# Patient Record
Sex: Female | Born: 1935
Health system: Southern US, Community
[De-identification: ages and names within clinical notes are randomized; demographics above are authoritative.]

## PROBLEM LIST (undated history)

## (undated) DIAGNOSIS — G629 Polyneuropathy, unspecified: Secondary | ICD-10-CM

## (undated) DIAGNOSIS — E119 Type 2 diabetes mellitus without complications: Secondary | ICD-10-CM

## (undated) DIAGNOSIS — I10 Essential (primary) hypertension: Secondary | ICD-10-CM

## (undated) DIAGNOSIS — C801 Malignant (primary) neoplasm, unspecified: Secondary | ICD-10-CM

## (undated) HISTORY — PX: BACK SURGERY: SHX140

## (undated) HISTORY — PX: CHOLECYSTECTOMY: SHX55

## (undated) HISTORY — PX: BREAST EXCISIONAL BIOPSY: SUR124

## (undated) HISTORY — PX: OTHER SURGICAL HISTORY: SHX169

## (undated) HISTORY — PX: ABDOMINAL HYSTERECTOMY: SHX81

---

## 1958-03-21 HISTORY — PX: BREAST EXCISIONAL BIOPSY: SUR124

## 1968-03-21 HISTORY — PX: OTHER SURGICAL HISTORY: SHX169

## 1978-11-20 DIAGNOSIS — C801 Malignant (primary) neoplasm, unspecified: Secondary | ICD-10-CM

## 1978-11-20 HISTORY — DX: Malignant (primary) neoplasm, unspecified: C80.1

## 1999-02-03 ENCOUNTER — Encounter: Payer: Self-pay | Admitting: Internal Medicine

## 1999-02-03 ENCOUNTER — Encounter: Admission: RE | Admit: 1999-02-03 | Discharge: 1999-02-03 | Payer: Self-pay | Admitting: Internal Medicine

## 2000-02-16 ENCOUNTER — Encounter: Payer: Self-pay | Admitting: Internal Medicine

## 2000-02-16 ENCOUNTER — Encounter: Admission: RE | Admit: 2000-02-16 | Discharge: 2000-02-16 | Payer: Self-pay | Admitting: Internal Medicine

## 2000-09-08 ENCOUNTER — Ambulatory Visit (HOSPITAL_COMMUNITY): Admission: RE | Admit: 2000-09-08 | Discharge: 2000-09-08 | Payer: Self-pay | Admitting: Gastroenterology

## 2001-03-05 ENCOUNTER — Encounter: Payer: Self-pay | Admitting: Internal Medicine

## 2001-03-05 ENCOUNTER — Encounter: Admission: RE | Admit: 2001-03-05 | Discharge: 2001-03-05 | Payer: Self-pay | Admitting: Internal Medicine

## 2002-03-25 ENCOUNTER — Encounter: Payer: Self-pay | Admitting: Internal Medicine

## 2002-03-25 ENCOUNTER — Encounter: Admission: RE | Admit: 2002-03-25 | Discharge: 2002-03-25 | Payer: Self-pay | Admitting: Internal Medicine

## 2003-04-01 ENCOUNTER — Encounter: Admission: RE | Admit: 2003-04-01 | Discharge: 2003-04-01 | Payer: Self-pay | Admitting: Internal Medicine

## 2003-07-17 ENCOUNTER — Encounter: Admission: RE | Admit: 2003-07-17 | Discharge: 2003-07-17 | Payer: Self-pay | Admitting: Rheumatology

## 2004-04-15 ENCOUNTER — Encounter: Admission: RE | Admit: 2004-04-15 | Discharge: 2004-04-15 | Payer: Self-pay | Admitting: Internal Medicine

## 2004-05-26 ENCOUNTER — Encounter: Admission: RE | Admit: 2004-05-26 | Discharge: 2004-05-26 | Payer: Self-pay | Admitting: Orthopedic Surgery

## 2004-05-28 ENCOUNTER — Ambulatory Visit (HOSPITAL_COMMUNITY): Admission: RE | Admit: 2004-05-28 | Discharge: 2004-05-28 | Payer: Self-pay | Admitting: Orthopedic Surgery

## 2004-05-28 ENCOUNTER — Ambulatory Visit (HOSPITAL_BASED_OUTPATIENT_CLINIC_OR_DEPARTMENT_OTHER): Admission: RE | Admit: 2004-05-28 | Discharge: 2004-05-28 | Payer: Self-pay | Admitting: Orthopedic Surgery

## 2005-04-20 ENCOUNTER — Encounter: Admission: RE | Admit: 2005-04-20 | Discharge: 2005-04-20 | Payer: Self-pay | Admitting: Internal Medicine

## 2006-04-21 ENCOUNTER — Encounter: Admission: RE | Admit: 2006-04-21 | Discharge: 2006-04-21 | Payer: Self-pay | Admitting: Internal Medicine

## 2007-04-25 ENCOUNTER — Encounter: Admission: RE | Admit: 2007-04-25 | Discharge: 2007-04-25 | Payer: Self-pay | Admitting: Internal Medicine

## 2008-04-16 ENCOUNTER — Encounter: Admission: RE | Admit: 2008-04-16 | Discharge: 2008-04-16 | Payer: Self-pay | Admitting: Specialist

## 2008-05-23 ENCOUNTER — Encounter: Admission: RE | Admit: 2008-05-23 | Discharge: 2008-05-23 | Payer: Self-pay | Admitting: Internal Medicine

## 2008-05-29 ENCOUNTER — Inpatient Hospital Stay (HOSPITAL_COMMUNITY): Admission: RE | Admit: 2008-05-29 | Discharge: 2008-05-31 | Payer: Self-pay | Admitting: Specialist

## 2010-07-01 LAB — BASIC METABOLIC PANEL
BUN: 4 mg/dL — ABNORMAL LOW (ref 6–23)
BUN: 5 mg/dL — ABNORMAL LOW (ref 6–23)
BUN: 8 mg/dL (ref 6–23)
CO2: 26 mEq/L (ref 19–32)
CO2: 26 mEq/L (ref 19–32)
CO2: 29 mEq/L (ref 19–32)
Calcium: 8.3 mg/dL — ABNORMAL LOW (ref 8.4–10.5)
Calcium: 8.5 mg/dL (ref 8.4–10.5)
Calcium: 8.6 mg/dL (ref 8.4–10.5)
Chloride: 104 mEq/L (ref 96–112)
Chloride: 96 mEq/L (ref 96–112)
Chloride: 98 mEq/L (ref 96–112)
Creatinine, Ser: 0.44 mg/dL (ref 0.4–1.2)
Creatinine, Ser: 0.46 mg/dL (ref 0.4–1.2)
Creatinine, Ser: 0.47 mg/dL (ref 0.4–1.2)
GFR calc Af Amer: >60 mL/min (ref >60)
GFR calc Af Amer: >60 mL/min (ref >60)
GFR calc Af Amer: >60 mL/min (ref >60)
GFR calc non Af Amer: >60 mL/min (ref >60)
GFR calc non Af Amer: >60 mL/min (ref >60)
GFR calc non Af Amer: >60 mL/min (ref >60)
Glucose, Bld: 117 mg/dL — ABNORMAL HIGH (ref 70–99)
Glucose, Bld: 133 mg/dL — ABNORMAL HIGH (ref 70–99)
Glucose, Bld: 147 mg/dL — ABNORMAL HIGH (ref 70–99)
Potassium: 3.6 mEq/L (ref 3.5–5.1)
Potassium: 3.8 mEq/L (ref 3.5–5.1)
Potassium: 4 mEq/L (ref 3.5–5.1)
Sodium: 127 mEq/L — ABNORMAL LOW (ref 135–145)
Sodium: 131 mEq/L — ABNORMAL LOW (ref 135–145)
Sodium: 136 mEq/L (ref 135–145)

## 2010-07-01 LAB — CBC
HCT: 27.3 % — ABNORMAL LOW (ref 36.0–46.0)
HCT: 27.7 % — ABNORMAL LOW (ref 36.0–46.0)
HCT: 34.8 % — ABNORMAL LOW (ref 36.0–46.0)
HCT: 39.2 % (ref 36.0–46.0)
Hemoglobin: 11.5 g/dL — ABNORMAL LOW (ref 12.0–15.0)
Hemoglobin: 13 g/dL (ref 12.0–15.0)
Hemoglobin: 9.1 g/dL — ABNORMAL LOW (ref 12.0–15.0)
Hemoglobin: 9.4 g/dL — ABNORMAL LOW (ref 12.0–15.0)
MCHC: 33 g/dL (ref 30.0–36.0)
MCHC: 33 g/dL (ref 30.0–36.0)
MCHC: 33.5 g/dL (ref 30.0–36.0)
MCHC: 33.9 g/dL (ref 30.0–36.0)
MCV: 100.1 fL — ABNORMAL HIGH (ref 78.0–100.0)
MCV: 100.7 fL — ABNORMAL HIGH (ref 78.0–100.0)
MCV: 100.9 fL — ABNORMAL HIGH (ref 78.0–100.0)
MCV: 101 fL — ABNORMAL HIGH (ref 78.0–100.0)
Platelets: 132 10*3/uL — ABNORMAL LOW (ref 150–400)
Platelets: 151 10*3/uL (ref 150–400)
Platelets: 201 10*3/uL (ref 150–400)
Platelets: 245 10*3/uL (ref 150–400)
RBC: 2.71 MIL/uL — ABNORMAL LOW (ref 3.87–5.11)
RBC: 2.76 MIL/uL — ABNORMAL LOW (ref 3.87–5.11)
RBC: 3.46 MIL/uL — ABNORMAL LOW (ref 3.87–5.11)
RBC: 3.88 MIL/uL (ref 3.87–5.11)
RDW: 12.9 % (ref 11.5–15.5)
RDW: 13 % (ref 11.5–15.5)
RDW: 13.1 % (ref 11.5–15.5)
RDW: 13.2 % (ref 11.5–15.5)
WBC: 6 10*3/uL (ref 4.0–10.5)
WBC: 6.3 10*3/uL (ref 4.0–10.5)
WBC: 6.9 10*3/uL (ref 4.0–10.5)
WBC: 7.8 10*3/uL (ref 4.0–10.5)

## 2010-07-01 LAB — URINALYSIS, ROUTINE W REFLEX MICROSCOPIC
Bilirubin Urine: NEGATIVE
Glucose, UA: NEGATIVE mg/dL
Hgb urine dipstick: NEGATIVE
Ketones, ur: NEGATIVE mg/dL
Nitrite: NEGATIVE
Protein, ur: NEGATIVE mg/dL
Specific Gravity, Urine: 1.004 — ABNORMAL LOW (ref 1.005–1.030)
Urobilinogen, UA: 0.2 mg/dL (ref 0.0–1.0)
pH: 6.5 (ref 5.0–8.0)

## 2010-07-01 LAB — GLUCOSE, CAPILLARY
Glucose-Capillary: 119 mg/dL — ABNORMAL HIGH (ref 70–99)
Glucose-Capillary: 121 mg/dL — ABNORMAL HIGH (ref 70–99)
Glucose-Capillary: 133 mg/dL — ABNORMAL HIGH (ref 70–99)
Glucose-Capillary: 166 mg/dL — ABNORMAL HIGH (ref 70–99)
Glucose-Capillary: 188 mg/dL — ABNORMAL HIGH (ref 70–99)
Glucose-Capillary: 84 mg/dL (ref 70–99)

## 2010-07-01 LAB — COMPREHENSIVE METABOLIC PANEL
ALT: 19 U/L (ref 0–35)
AST: 23 U/L (ref 0–37)
Albumin: 4.5 g/dL (ref 3.5–5.2)
Alkaline Phosphatase: 34 U/L — ABNORMAL LOW (ref 39–117)
BUN: 6 mg/dL (ref 6–23)
CO2: 29 mEq/L (ref 19–32)
Calcium: 9.8 mg/dL (ref 8.4–10.5)
Chloride: 102 mEq/L (ref 96–112)
Creatinine, Ser: 0.61 mg/dL (ref 0.4–1.2)
GFR calc Af Amer: >60 mL/min (ref >60)
GFR calc non Af Amer: >60 mL/min (ref >60)
Glucose, Bld: 150 mg/dL — ABNORMAL HIGH (ref 70–99)
Potassium: 4.7 mEq/L (ref 3.5–5.1)
Sodium: 140 mEq/L (ref 135–145)
Total Bilirubin: 0.7 mg/dL (ref 0.3–1.2)
Total Protein: 7 g/dL (ref 6.0–8.3)

## 2010-07-01 LAB — PROTIME-INR
INR: 1.1 (ref 0.00–1.49)
Prothrombin Time: 14.1 seconds (ref 11.6–15.2)

## 2010-07-01 LAB — APTT: aPTT: 37 seconds (ref 24–37)

## 2010-08-03 NOTE — Op Note (Signed)
[name], [name]               ACCOUNT NO.:  [id number]   MEDICAL RECORD NO.:  [id number]          PATIENT TYPE:  INP   LOCATION:  1605                         FACILITY:  [facility]   PHYSICIAN:  [name], M.D.    DATE OF BIRTH:  28-Feb-1934   DATE OF PROCEDURE:  05/29/2008  DATE OF DISCHARGE:                               OPERATIVE REPORT   PREOPERATIVE DIAGNOSIS:  Spinal stenosis, degenerative scoliosis L3-4,  L4-5.   POSTOPERATIVE DIAGNOSIS:  Spinal stenosis, degenerative scoliosis L3-4,  L4-5.   PROCEDURE:  1. Central decompression at L3-4, L4-5 by removal of the central      lamina of L4 and L3.  2. Bilateral lateral recess decompressions of L3-4 and L4-5.  3. Foraminotomies of L3, L4, L5.  4. Lateral mass fusion at L3-4 and L4-5 utilizing autologous      cancellous bone graft.   ANESTHESIA:  General.   SURGEON:  [name], M.D.   ASSISTANT:  Georges Lynch. Gioffre, M.D.   BRIEF HISTORY:  This 77 year old with degenerative scoliosis and spinal  stenosis particularly on the concavity of the curve on the left with  myelogram defects noted at L3-4 and L4-5.  This is multifactorial.  She  was indicated for decompression at two levels and possible lateral mass  fusion.  Risks and benefits were discussed including bleeding,  infection, damage to neurovascular structures, CSF leakage, epidural  fibrosis, adjacent segment disease, need for fusion in the future,  anesthetic complications, etc.   TECHNIQUE:  Placed in supine position after induction of adequate  general anesthesia, 1 gram Kefzol, she was placed prone on the [place]  frame.  All bony prominences well-padded.  Foley was free to gravity.  Lumbar region prepped and draped in the usual sterile fashion.  Surgical  incision was made from the spinous process of L3 to S1.  Subcutaneous  tissue was dissected.  Electrocautery utilized to achieve hemostasis.  She had a fair amount of generalized bleeding.  Bipolar  electrocautery  was utilized to achieve strict hemostasis as well as appropriate blood  pressure control.  We had to use quarter percent Marcaine with  epinephrine into the subcutaneous tissue.  Dorsolumbar fascia identified  and divided in line with skin incision.  Paraspinous muscle elevated  from the lamina of L3-4 and L4-5.  We obtained radiographs with spinous  processes of L3 and between the L4-5 spinous processes.  McCullough  retractor was then placed.  Removed the spinous processes at L3 and L4  and partially at L5.  This was after the spinous processes were  skeletonized and then morselized and the cancellous bone was then saved  for later bone grafting.  We removed the lamina of L3 utilizing a 2 mm  and then a 3 mm Kerrison starting from the caudad edge of L3 moving  cephalad using the operating microscope.  Neural elements were protected  at all times with neural patties.  There was fairly severe stenosis in  the lateral recess at L3-4.  After performing the laminectomy of L3 and  of L4 we decompressed the lateral recesses to  the medial border of the  pedicle with significant stenosis noted particularly at the L3-4 and L4-  5 on the left.  Foraminotomies of L3 and L4 were performed as well.  Bipolar electrocautery was utilized to achieve hemostasis.  There was  mild to moderate motion at the facets at L3-4 and mildly at L4-5.  We  decompressed the lateral recess on the right as well.  Again the  predominance of the symptoms and the pathology was noted on the left.  Again, a fair amount of just generalized bleeding and we continued  throughout with meticulous hemostasis utilizing electrocautery, FloSeal  and thrombin-soaked Gelfoam.  We were able to achieve good hemostasis.  Following the laminectomies and the decompression of the lateral recess  we placed a hockey stick probe into the foramen of L3, L4 and L5  bilaterally.  They were widely patent.  We had obtained radiographs  with  a Penfield in the lamina during the, at the beginning of the  decompression and then caudad to show the levels of L3-4 and L4-5.  The  wound was copiously irrigated.  We lost 125 mL of blood.  We put bone  wax on the cancellous surfaces.  Achieved strict hemostasis.  No  evidence of CSF leakage.  We then put FloSeal in the laminar defect.  Out laterally just lateral to the facets at L3-4 and at L4-5 we exposed  the transverse process, these and the lateral aspect of the facets and  the pars.  She had very little soft tissue here so the exposure was  accomplished without difficulty.  Mild curetting of the surfaces was  performed and then we morselized and the cancellus bone graft impacted  out into the lateral masses at L3-4 and at L4-5.  Again, strict  electrocautery was performed and at this point there was no active  bleeding.  We placed FloSeal out into the lateral recesses as well.  Removed the [facility] retractor.  We irrigated paraspinous muscle and  inspected with no evidence of active bleeding.  Again the FloSeal was  placed in the lateral recesses and centrally.  Paraspinous muscle was  inspected.  Strict hemostasis obtained.  We then repaired the  dorsolumbar fascia with #1 Vicryl interrupted figure-of-eight sutures.  Small area cephalad opened for any potential draining.  Subcu closed as  well with 2-0 Vicryl simple sutures and skin with staples.  The wound  was dressed sterilely, secured with ______ dressing and placed supine on  hospital bed, extubated without difficulty and transported to the  recovery room in satisfactory condition.  The patient tolerated the  procedure with no complications.      [name], M.D.  Electronically Signed     JB/MEDQ  D:  05/29/2008  T:  05/29/2008  Job:  [id number]

## 2010-08-03 NOTE — H&P (Signed)
NAMELIESL, SIMONS               ACCOUNT NO.:  [id number]   MEDICAL RECORD NO.:  [id number]          PATIENT TYPE:  INP   LOCATION:  NA                           FACILITY:  [facility]   PHYSICIAN:  [name], M.D.    DATE OF BIRTH:  Mar 10, 1934   DATE OF ADMISSION:  05/29/2008  DATE OF DISCHARGE:                              HISTORY & PHYSICAL   CHIEF COMPLAINT:  Low back and right buttock and hip pain.   HISTORY:  MS. Giaimo is a pleasant 77 year old female with a  longstanding history of bilateral lower extremity pain.  She has been  treated conservatively per Dr. Ethelene Hal for quite some time.  She was sent  to Dr. Shelle Iron in early January with disabling symptoms which were  limiting her ambulatory ability.  It was found on CT scan that she had  severe stenosis at 4-5.  She has had multiple injections in the past  with only temporary relief.  It is felt at this point, due to the  severity of her stenosis and limitation of her ability to ambulate, she  would benefit from a lumbar decompression.  The risks and benefits of  this were discussed with the patient as well as medical clearance  obtained.  She does wish to proceed.   MEDICAL HISTORY:  Hypertension.  Borderline diabetes.  Gastroesophageal  reflux disease, osteoporosis, history of uterine cancer.   CURRENT MEDICATIONS:  1. Monopril HCl 20 mg 1 p.o. daily.  2. Alendronate sodium 70 mg 1 p.o. daily.  3. Neurontin, the patient unsure of dosage.  4. Tylenol p.r.n. pain.   ALLERGIES:  CODEINE causes dizziness.   PAST SURGERIES:  Include hysterectomy, cholecystectomy, biopsies of the  breast and kidney which were negative.   SOCIAL HISTORY:  The patient is married.  History is negative for  tobacco or alcohol.  Husband will be caregiver following surgery.   PRIMARY CARE PHYSICIAN:  Dr. Earl Gala.   FAMILY HISTORY:  Noncontributory.   REVIEW OF SYSTEMS:  GENERAL:  The patient denied any fever, chills,  night sweats or  bleeding tendencies.  CNS:  No blurred or double vision,  seizure, headache or paralysis.  RESPIRATORY:  No shortness of breath,  productive cough or hemoptysis.  CARDIOVASCULAR:  No chest pain, angina,  orthopnea.  GU:  No dysuria, hematuria, discharge.  GI:  No nausea,  vomiting, diarrhea, constipation, melena or bloody stools.  MUSCULOSKELETAL:  As per HPI.   PHYSICAL EXAM:  VITAL SIGNS:  Pulse 62, respiratory rate 10, BP 106/70.  GENERAL:  This is a thin elderly female who walks in a forward-flexed  position.  HEENT: Atraumatic, normocephalic.  Pupils equal round and reactive to  light.  EOMs intact.  NECK:  Supple.  No lymphadenopathy.  CHEST:  Clear to auscultation bilaterally.  No rhonchi, wheezes or  rales.  HEART:  Regular rate and rhythm without murmurs, gallops or rubs.  ABDOMEN:  Soft, nontender, nondistended.  Bowel sounds x4.  SKIN:  No rashes or lesions are noted.  BACK:  The patient does have pain with extension.  She has positive  straight leg raise on left that produces buttock and thigh pain.  Straight leg raise on the right produces buttock pain only.  EHL is 5-5.   IMPRESSION:  Severe stenosis at 3-4 and 4-5.   PLAN:  The will be patient admitted to [facility] to undergo a  lumbar decompression 3-4 and 4-5 with possible lateral mass fusion.      [name], P.A.      [name], M.D.  Electronically Signed    CS/MEDQ  D:  05/20/2008  T:  05/20/2008  Job:  [id number]

## 2010-08-06 NOTE — Op Note (Signed)
NAMEJOSEFINE, [name]               ACCOUNT NO.:  [id number]   MEDICAL RECORD NO.:  [id number]          PATIENT TYPE:  AMB   LOCATION:  DSC                          FACILITY:  [facility]   PHYSICIAN:  Katy Fitch. Sypher Montez Hageman., M.D.DATE OF BIRTH:  11-02-1933   DATE OF PROCEDURE:  05/28/2004  DATE OF DISCHARGE:                                 OPERATIVE REPORT   PREOPERATIVE DIAGNOSIS:  1.  Chronic stenosing tenosynovitis right long finger at A1 pulley.  2.  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   POSTOPERATIVE DIAGNOSIS:  1.  Chronic stenosing tenosynovitis right long finger at A1 pulley.  2.  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   OPERATION:  1.  Release of right long finger A1 pulley.  2.  Irrigation and debridement of left long finger DIP joint with resection      of mucous cyst and resection of marginal osteophytes from distal      interphalangeal joint primarily at base of distal phalanx dorsal radial      aspect.   OPERATION SURGEON:  Katy Fitch. Sypher, M.D.   ASSISTANT:  Marveen Reeks. Dasnoit, P.A.C.   ANESTHESIA:  Marcaine 25% and 2% lidocaine metacarpal head level block of  right long finger and left long finger supplemented by IV sedation.   SUPERVISING ANESTHESIOLOGIST:  Dr. Gelene Mink.   INDICATIONS:  [name] is a 77 year old woman referred by Dr. Earl Gala  for evaluation and management of bilateral hand problems.   She is noted to have extensive osteoarthrosis with hypertrophic Heberden's  nodes affecting all of her distal interphalangeal joints.   She has been experiencing triggering of her right long finger at the A1  pulley with chronic pain and drainage from a cyst on the dorsal radial  aspect of right long finger distal interphalangeal joint.   After consultation the office, we  recommended proceeding with a combined  procedure on the right and left hands during which we would release the  right long finger A1 pulley and debride the DIP joint with removal of large  marginal osteophytes in an attempt to prevent recurrent rupture of her left  long finger mucous cyst.   After informed consent, she is brought to operating at this time.   Preoperatively she was advised that we could not effect the course of her  osteoarthrosis with the surgery. The goal of our surgery on the left long  finger was strictly to prevent recurrent rupture and possible joint sepsis.   She understands that should she have recurrence of her cyst, arthrodesis of  the DIP joint would be indicated.   With respect to her right hand, she understands that release of the A1  pulley should relieve her triggering symptoms.   PROCEDURE:  [name] is brought to the operating room and placed in  supine position upon the operating table.   Following light sedation, the right and left  arms were prepped with Betadine  soap and solution and sterilely draped.   Marcaine 0.25% and 2% lidocaine were infiltrated at metacarpal head level  for the right long finger including the flexor sheath and for the left long  finger to obtain a digital block.   When anesthesia was satisfactory, the arms were exsanguinated with an  arterial tourniquet inflated on the proximal brachium on the right.   The procedure commenced with short transverse incision in the distal palmar  crease, exposing the A1 pulley of the right long finger. The palmar fascia  pretendinous fibers were released with  scissors followed by careful  isolation of the pulley with a Freer and scissors dissection.   After protection neurovascular bundles, the pulley was released along its  radial border. The tendons were delivered. No residual triggering persisted.   There no signs of significant tenosynovitis or significant tendon  pathology.   The wound was then repaired with mattress suture of 5-0 nylon. A compressive  dressing applied with Xeroflo, sterile gauze and Ace wrap on the right hand  followed by release of tourniquet. There is immediate capillary refill in  the fingers and thumb.   Attention directed to the left long finger.   The long finger was exsanguinated with a gauze wrap followed by placement of  a inch wide band of Esmarch and a digital tourniquet over the P1 segment.   Procedure commenced with a curvilinear incision exposing the dorsal cyst.   Very large abutting marginal osteophytes were noted on the radial and ulnar  aspect of the finger.   The cyst was circumferentially dissected and the entire membrane and cyst  contents excised.   A arthrotomy was performed by resecting the capsule between the terminal  slip of the extensor tendon and the radial collateral ligament.   Several loose bodies and a large dorsal osteophyte were resected with a  micro curette. The joint was thoroughly irrigated with a 18 gauge needle and  syringe repeatedly clearing all mucinous fluid as well as debris from the  joint.   I elected not to open the dorsal ulnar aspect of the DIP joint as there was  no sign of cyst formation.   We made no effort to resect the Heberden's nodes.   The wound was then irrigated and repaired with mattress suture of 5-0 nylon.   A compressive dressing was applied to the left long finger with Xeroflo,  sterile gauze and Coban followed by release of the digital tourniquet.   There were no apparent complications..   [name] tolerated surgery and anesthesia well. She was transferred to  the recovery room with stable vital signs.      RVS/MEDQ  D:  05/28/2004  T:  05/28/2004  Job:  [id number]   cc:   Theressa Millard, M.D.  [address] [place]  [place] [address]  Fax: [phone]

## 2010-08-06 NOTE — Discharge Summary (Signed)
[name], [name]               ACCOUNT NO.:  [id number]   MEDICAL RECORD NO.:  [id number]          PATIENT TYPE:  INP   LOCATION:  1605                         FACILITY:  [facility]   PHYSICIAN:  [name], M.D.    DATE OF BIRTH:  09-26-1933   DATE OF ADMISSION:  05/29/2008  DATE OF DISCHARGE:  05/31/2008                               DISCHARGE SUMMARY   ADMISSION DIAGNOSES:  1. Severe spinal stenosis at L3-4 and L4-5.  2. Hypertension.  3. Diabetes.  4. Gastroesophageal reflux disease.  5. Osteoporosis.  6. History of uterine cancer.   DISCHARGE DIAGNOSES:  1. Severe spinal stenosis at L3-4 and L4-5.  2. Hypertension.  3. Diabetes.  4. History of reflux disease.  5. Osteoporosis.  6. History of uterine cancer.  7. Lumbar decompression at L3-4, L4-5.   PROCEDURES:  The patient was taken to the operating room on March 11th,  underwent central decompression at L3-4 and L4-5 centrally, lateral mass  fusion was performed at L3-4 and L405.  Surgeon Dr. Isaias Casey,  assistant Dr. Ranee Casey.  Anesthesia:  General.  Complications  none.   CONSULTATIONS:  PT/OT, care management.   HISTORY:  The patient is a pleasant 77 year old female with a  longstanding history of bilateral lower extremity pain.  She was  initially treated conservatively by Dr. Ethelene Casey for quite some time with  multiple injections as well as pain medication but has noted progressive  disabling symptoms.  CT scan was obtained which showed severe stenosis  at L4-5.  It was felt that at this point due to the severity of the  patient's symptoms and limitation of her ability to ambulate, that she  would benefit from a central decompression.  The risks and benefits of  this were discussed with the patient.  She has elected to proceed.   LABORATORY DATA:  Admission CBC showed a white cell count of 6,  hemoglobin 13, hematocrit 39.2.  This was monitored throughout the  hospital course.  White cell count  remained normal, at time of discharge  6.9, hemoglobin did fluctuate, at time of discharge 9.1, hematocrit  27.3.  The patient was asymptomatic.  Coagulation studies done  preoperatively were within normal range.  Preoperative __________showed  a sodium of 140, potassium 4.7, elevated glucose of 150, normal BUN and  creatinine.  This was monitored, sodium did drop to a level of 127.  The  patient was asymptomatic.  Potassium 3.8, glucose 117, and BUN and  creatinine remained within normal range.  GFR is greater than 60.  Routine liver function tests within normal range.  Preoperative  urinalysis was negative.  Preoperative EKG showed normal sinus rhythm.  Preoperative chest x-ray showed stable cardiopulmonary appearance with  no active disease noted.   HOSPITAL COURSE:  The patient was admitted, taken to the OR and  underwent the above- stated procedure without difficulty.  She was then  transferred to the PACU and then to the orthopedic floor for continued  postoperative care.  Postoperatively, the patient did fairly well.  She  noted low back pain as expected.  Denied any lower extremity symptoms.  Pain was fairly well-controlled.  She denied any chest pain or shortness  of breath.  Vital signs were stable except for a decreased blood  pressure and poor urinary output.  Hemoglobin was stable at 9.4,  hematocrit 27.7, slightly decreased sodium at 131.  At this point we did  increase the patient's fluids, gave her a bolus which did improve her  urinary output as well as her blood pressure.  We encouraged incentive  spirometer and managed her diet, __________ PT/OT.  The patient did well  with physical therapy.  She was minimal assist with walking and getting  up and out of bed, and felt she was stable to be discharged with home  health and PT.  On postop day #2 the patient continued do significantly  better, blood pressure was much improved.  Labs remained stable.  On  postop day #3, the  patient was moving with minimal pain.  She was  tolerating p.o. well.  Hemoglobin was stable, slightly low sodium, but  the patient was asymptomatic.  Motor and neurovascular function was  intact to the lower extremity, felt the patient was stable to be  discharged home.   DISPOSITION:  The patient discharged home.  She is to follow up with Dr.  Shelle Casey in approximately 10-14 days.  She is to call the office for an  appointment.  Wound care:  She is to change her dressing daily, keep  this area clean and dry.   DIET:  As tolerated.   ACTIVITY:  She is to walk as tolerated.  No bending twisting or lifting.  It is okay for her to shower.  No driving.   DISCHARGE MEDICATIONS:  Home medications along with vitamin C 500 mg  daily, Norco 5/325  one to two p.o. q. 4-6 p.r.n. pain, Robaxin 1 p.o.  q. 8 p.r.n. spasm, Colace 100 mg p.o. b.i.d.   CONDITION ON DISCHARGE:  Stable.   FINAL DIAGNOSIS:  Central decompression L3-4 and L4-5 with lateral mass  fusion.      [name], P.A.      [name], M.D.  Electronically Signed    CS/MEDQ  D:  07/08/2008  T:  07/08/2008  Job:  [id number]

## 2011-01-18 ENCOUNTER — Encounter (INDEPENDENT_AMBULATORY_CARE_PROVIDER_SITE_OTHER): Payer: Medicare Other | Admitting: Ophthalmology

## 2011-01-18 DIAGNOSIS — E11319 Type 2 diabetes mellitus with unspecified diabetic retinopathy without macular edema: Secondary | ICD-10-CM

## 2011-01-18 DIAGNOSIS — H3530 Unspecified macular degeneration: Secondary | ICD-10-CM

## 2011-01-18 DIAGNOSIS — H43819 Vitreous degeneration, unspecified eye: Secondary | ICD-10-CM

## 2011-03-28 DIAGNOSIS — M48061 Spinal stenosis, lumbar region without neurogenic claudication: Secondary | ICD-10-CM | POA: Diagnosis not present

## 2011-03-28 DIAGNOSIS — D649 Anemia, unspecified: Secondary | ICD-10-CM | POA: Diagnosis not present

## 2011-03-28 DIAGNOSIS — M1990 Unspecified osteoarthritis, unspecified site: Secondary | ICD-10-CM | POA: Diagnosis not present

## 2011-03-28 DIAGNOSIS — M2550 Pain in unspecified joint: Secondary | ICD-10-CM | POA: Diagnosis not present

## 2011-03-28 DIAGNOSIS — M159 Polyosteoarthritis, unspecified: Secondary | ICD-10-CM | POA: Diagnosis not present

## 2011-03-28 DIAGNOSIS — IMO0001 Reserved for inherently not codable concepts without codable children: Secondary | ICD-10-CM | POA: Diagnosis not present

## 2011-05-16 DIAGNOSIS — I10 Essential (primary) hypertension: Secondary | ICD-10-CM | POA: Diagnosis not present

## 2011-05-16 DIAGNOSIS — E1139 Type 2 diabetes mellitus with other diabetic ophthalmic complication: Secondary | ICD-10-CM | POA: Diagnosis not present

## 2011-05-16 DIAGNOSIS — E11319 Type 2 diabetes mellitus with unspecified diabetic retinopathy without macular edema: Secondary | ICD-10-CM | POA: Diagnosis not present

## 2011-05-16 DIAGNOSIS — M899 Disorder of bone, unspecified: Secondary | ICD-10-CM | POA: Diagnosis not present

## 2011-05-16 DIAGNOSIS — Z1331 Encounter for screening for depression: Secondary | ICD-10-CM | POA: Diagnosis not present

## 2011-06-28 DIAGNOSIS — D239 Other benign neoplasm of skin, unspecified: Secondary | ICD-10-CM | POA: Diagnosis not present

## 2011-09-21 DIAGNOSIS — E1139 Type 2 diabetes mellitus with other diabetic ophthalmic complication: Secondary | ICD-10-CM | POA: Diagnosis not present

## 2011-09-21 DIAGNOSIS — M899 Disorder of bone, unspecified: Secondary | ICD-10-CM | POA: Diagnosis not present

## 2011-09-21 DIAGNOSIS — E11319 Type 2 diabetes mellitus with unspecified diabetic retinopathy without macular edema: Secondary | ICD-10-CM | POA: Diagnosis not present

## 2011-09-21 DIAGNOSIS — M949 Disorder of cartilage, unspecified: Secondary | ICD-10-CM | POA: Diagnosis not present

## 2011-09-21 DIAGNOSIS — I10 Essential (primary) hypertension: Secondary | ICD-10-CM | POA: Diagnosis not present

## 2011-09-26 DIAGNOSIS — IMO0001 Reserved for inherently not codable concepts without codable children: Secondary | ICD-10-CM | POA: Diagnosis not present

## 2011-09-26 DIAGNOSIS — M159 Polyosteoarthritis, unspecified: Secondary | ICD-10-CM | POA: Diagnosis not present

## 2011-09-26 DIAGNOSIS — M545 Low back pain: Secondary | ICD-10-CM | POA: Diagnosis not present

## 2011-09-26 DIAGNOSIS — M1990 Unspecified osteoarthritis, unspecified site: Secondary | ICD-10-CM | POA: Diagnosis not present

## 2011-09-26 DIAGNOSIS — M48061 Spinal stenosis, lumbar region without neurogenic claudication: Secondary | ICD-10-CM | POA: Diagnosis not present

## 2011-09-26 DIAGNOSIS — M2550 Pain in unspecified joint: Secondary | ICD-10-CM | POA: Diagnosis not present

## 2012-01-18 ENCOUNTER — Encounter (INDEPENDENT_AMBULATORY_CARE_PROVIDER_SITE_OTHER): Payer: Medicare Other | Admitting: Ophthalmology

## 2012-01-18 DIAGNOSIS — I10 Essential (primary) hypertension: Secondary | ICD-10-CM

## 2012-01-18 DIAGNOSIS — E1139 Type 2 diabetes mellitus with other diabetic ophthalmic complication: Secondary | ICD-10-CM

## 2012-01-18 DIAGNOSIS — H43819 Vitreous degeneration, unspecified eye: Secondary | ICD-10-CM

## 2012-01-18 DIAGNOSIS — H35039 Hypertensive retinopathy, unspecified eye: Secondary | ICD-10-CM

## 2012-01-18 DIAGNOSIS — E11319 Type 2 diabetes mellitus with unspecified diabetic retinopathy without macular edema: Secondary | ICD-10-CM

## 2012-01-18 DIAGNOSIS — H3530 Unspecified macular degeneration: Secondary | ICD-10-CM

## 2012-01-25 DIAGNOSIS — Z23 Encounter for immunization: Secondary | ICD-10-CM | POA: Diagnosis not present

## 2012-02-14 DIAGNOSIS — E11319 Type 2 diabetes mellitus with unspecified diabetic retinopathy without macular edema: Secondary | ICD-10-CM | POA: Diagnosis not present

## 2012-02-14 DIAGNOSIS — M899 Disorder of bone, unspecified: Secondary | ICD-10-CM | POA: Diagnosis not present

## 2012-02-14 DIAGNOSIS — M949 Disorder of cartilage, unspecified: Secondary | ICD-10-CM | POA: Diagnosis not present

## 2012-02-14 DIAGNOSIS — E1139 Type 2 diabetes mellitus with other diabetic ophthalmic complication: Secondary | ICD-10-CM | POA: Diagnosis not present

## 2012-02-14 DIAGNOSIS — I10 Essential (primary) hypertension: Secondary | ICD-10-CM | POA: Diagnosis not present

## 2012-04-02 DIAGNOSIS — H26499 Other secondary cataract, unspecified eye: Secondary | ICD-10-CM | POA: Diagnosis not present

## 2012-04-02 DIAGNOSIS — H04129 Dry eye syndrome of unspecified lacrimal gland: Secondary | ICD-10-CM | POA: Diagnosis not present

## 2012-04-02 DIAGNOSIS — H40019 Open angle with borderline findings, low risk, unspecified eye: Secondary | ICD-10-CM | POA: Diagnosis not present

## 2012-05-09 DIAGNOSIS — H26499 Other secondary cataract, unspecified eye: Secondary | ICD-10-CM | POA: Diagnosis not present

## 2012-05-09 DIAGNOSIS — H04129 Dry eye syndrome of unspecified lacrimal gland: Secondary | ICD-10-CM | POA: Diagnosis not present

## 2012-05-09 DIAGNOSIS — H40019 Open angle with borderline findings, low risk, unspecified eye: Secondary | ICD-10-CM | POA: Diagnosis not present

## 2012-05-09 DIAGNOSIS — H01009 Unspecified blepharitis unspecified eye, unspecified eyelid: Secondary | ICD-10-CM | POA: Diagnosis not present

## 2012-05-11 DIAGNOSIS — M159 Polyosteoarthritis, unspecified: Secondary | ICD-10-CM | POA: Diagnosis not present

## 2012-05-11 DIAGNOSIS — M2550 Pain in unspecified joint: Secondary | ICD-10-CM | POA: Diagnosis not present

## 2012-05-11 DIAGNOSIS — M1990 Unspecified osteoarthritis, unspecified site: Secondary | ICD-10-CM | POA: Diagnosis not present

## 2012-05-11 DIAGNOSIS — M545 Low back pain: Secondary | ICD-10-CM | POA: Diagnosis not present

## 2012-05-28 DIAGNOSIS — E11319 Type 2 diabetes mellitus with unspecified diabetic retinopathy without macular edema: Secondary | ICD-10-CM | POA: Diagnosis not present

## 2012-05-28 DIAGNOSIS — I10 Essential (primary) hypertension: Secondary | ICD-10-CM | POA: Diagnosis not present

## 2012-05-28 DIAGNOSIS — E1139 Type 2 diabetes mellitus with other diabetic ophthalmic complication: Secondary | ICD-10-CM | POA: Diagnosis not present

## 2012-06-20 DIAGNOSIS — H4011X0 Primary open-angle glaucoma, stage unspecified: Secondary | ICD-10-CM | POA: Diagnosis not present

## 2012-06-20 DIAGNOSIS — H04129 Dry eye syndrome of unspecified lacrimal gland: Secondary | ICD-10-CM | POA: Diagnosis not present

## 2012-06-20 DIAGNOSIS — H01009 Unspecified blepharitis unspecified eye, unspecified eyelid: Secondary | ICD-10-CM | POA: Diagnosis not present

## 2012-06-20 DIAGNOSIS — H26499 Other secondary cataract, unspecified eye: Secondary | ICD-10-CM | POA: Diagnosis not present

## 2012-06-22 ENCOUNTER — Encounter (INDEPENDENT_AMBULATORY_CARE_PROVIDER_SITE_OTHER): Payer: Medicare Other | Admitting: Ophthalmology

## 2012-06-22 DIAGNOSIS — E11319 Type 2 diabetes mellitus with unspecified diabetic retinopathy without macular edema: Secondary | ICD-10-CM | POA: Diagnosis not present

## 2012-06-22 DIAGNOSIS — H3530 Unspecified macular degeneration: Secondary | ICD-10-CM | POA: Diagnosis not present

## 2012-06-22 DIAGNOSIS — E1139 Type 2 diabetes mellitus with other diabetic ophthalmic complication: Secondary | ICD-10-CM | POA: Diagnosis not present

## 2012-06-22 DIAGNOSIS — H35039 Hypertensive retinopathy, unspecified eye: Secondary | ICD-10-CM

## 2012-06-22 DIAGNOSIS — I10 Essential (primary) hypertension: Secondary | ICD-10-CM

## 2012-06-22 DIAGNOSIS — H43819 Vitreous degeneration, unspecified eye: Secondary | ICD-10-CM

## 2012-07-19 DIAGNOSIS — H01009 Unspecified blepharitis unspecified eye, unspecified eyelid: Secondary | ICD-10-CM | POA: Diagnosis not present

## 2012-07-19 DIAGNOSIS — H4011X0 Primary open-angle glaucoma, stage unspecified: Secondary | ICD-10-CM | POA: Diagnosis not present

## 2012-07-19 DIAGNOSIS — H04129 Dry eye syndrome of unspecified lacrimal gland: Secondary | ICD-10-CM | POA: Diagnosis not present

## 2012-07-19 DIAGNOSIS — H26499 Other secondary cataract, unspecified eye: Secondary | ICD-10-CM | POA: Diagnosis not present

## 2012-08-23 DIAGNOSIS — H04129 Dry eye syndrome of unspecified lacrimal gland: Secondary | ICD-10-CM | POA: Diagnosis not present

## 2012-08-23 DIAGNOSIS — H4011X0 Primary open-angle glaucoma, stage unspecified: Secondary | ICD-10-CM | POA: Diagnosis not present

## 2012-08-23 DIAGNOSIS — H01009 Unspecified blepharitis unspecified eye, unspecified eyelid: Secondary | ICD-10-CM | POA: Diagnosis not present

## 2012-08-23 DIAGNOSIS — H26499 Other secondary cataract, unspecified eye: Secondary | ICD-10-CM | POA: Diagnosis not present

## 2012-10-01 DIAGNOSIS — I10 Essential (primary) hypertension: Secondary | ICD-10-CM | POA: Diagnosis not present

## 2012-10-01 DIAGNOSIS — E1139 Type 2 diabetes mellitus with other diabetic ophthalmic complication: Secondary | ICD-10-CM | POA: Diagnosis not present

## 2012-10-01 DIAGNOSIS — E11319 Type 2 diabetes mellitus with unspecified diabetic retinopathy without macular edema: Secondary | ICD-10-CM | POA: Diagnosis not present

## 2012-11-14 DIAGNOSIS — M1990 Unspecified osteoarthritis, unspecified site: Secondary | ICD-10-CM | POA: Diagnosis not present

## 2012-11-14 DIAGNOSIS — M2550 Pain in unspecified joint: Secondary | ICD-10-CM | POA: Diagnosis not present

## 2012-11-14 DIAGNOSIS — M19049 Primary osteoarthritis, unspecified hand: Secondary | ICD-10-CM | POA: Diagnosis not present

## 2012-11-14 DIAGNOSIS — Z79899 Other long term (current) drug therapy: Secondary | ICD-10-CM | POA: Diagnosis not present

## 2012-12-19 DIAGNOSIS — Z23 Encounter for immunization: Secondary | ICD-10-CM | POA: Diagnosis not present

## 2013-01-17 ENCOUNTER — Ambulatory Visit (INDEPENDENT_AMBULATORY_CARE_PROVIDER_SITE_OTHER): Payer: Commercial Managed Care - PPO | Admitting: Ophthalmology

## 2013-01-23 DIAGNOSIS — E1139 Type 2 diabetes mellitus with other diabetic ophthalmic complication: Secondary | ICD-10-CM | POA: Diagnosis not present

## 2013-01-23 DIAGNOSIS — I10 Essential (primary) hypertension: Secondary | ICD-10-CM | POA: Diagnosis not present

## 2013-01-23 DIAGNOSIS — E11319 Type 2 diabetes mellitus with unspecified diabetic retinopathy without macular edema: Secondary | ICD-10-CM | POA: Diagnosis not present

## 2013-05-27 DIAGNOSIS — I10 Essential (primary) hypertension: Secondary | ICD-10-CM | POA: Diagnosis not present

## 2013-05-27 DIAGNOSIS — E1139 Type 2 diabetes mellitus with other diabetic ophthalmic complication: Secondary | ICD-10-CM | POA: Diagnosis not present

## 2013-05-27 DIAGNOSIS — E11319 Type 2 diabetes mellitus with unspecified diabetic retinopathy without macular edema: Secondary | ICD-10-CM | POA: Diagnosis not present

## 2013-06-14 DIAGNOSIS — Z961 Presence of intraocular lens: Secondary | ICD-10-CM | POA: Diagnosis not present

## 2013-06-14 DIAGNOSIS — H40019 Open angle with borderline findings, low risk, unspecified eye: Secondary | ICD-10-CM | POA: Diagnosis not present

## 2013-06-24 ENCOUNTER — Ambulatory Visit (INDEPENDENT_AMBULATORY_CARE_PROVIDER_SITE_OTHER): Payer: Medicare Other | Admitting: Ophthalmology

## 2013-07-01 ENCOUNTER — Ambulatory Visit (INDEPENDENT_AMBULATORY_CARE_PROVIDER_SITE_OTHER): Payer: Medicare Other | Admitting: Ophthalmology

## 2013-07-01 DIAGNOSIS — E11319 Type 2 diabetes mellitus with unspecified diabetic retinopathy without macular edema: Secondary | ICD-10-CM

## 2013-07-01 DIAGNOSIS — H35039 Hypertensive retinopathy, unspecified eye: Secondary | ICD-10-CM

## 2013-07-01 DIAGNOSIS — E1165 Type 2 diabetes mellitus with hyperglycemia: Secondary | ICD-10-CM | POA: Diagnosis not present

## 2013-07-01 DIAGNOSIS — H3530 Unspecified macular degeneration: Secondary | ICD-10-CM | POA: Diagnosis not present

## 2013-07-01 DIAGNOSIS — I10 Essential (primary) hypertension: Secondary | ICD-10-CM | POA: Diagnosis not present

## 2013-07-01 DIAGNOSIS — E1139 Type 2 diabetes mellitus with other diabetic ophthalmic complication: Secondary | ICD-10-CM | POA: Diagnosis not present

## 2013-07-01 DIAGNOSIS — H43819 Vitreous degeneration, unspecified eye: Secondary | ICD-10-CM

## 2013-07-15 DIAGNOSIS — C44621 Squamous cell carcinoma of skin of unspecified upper limb, including shoulder: Secondary | ICD-10-CM | POA: Diagnosis not present

## 2013-07-15 DIAGNOSIS — L570 Actinic keratosis: Secondary | ICD-10-CM | POA: Diagnosis not present

## 2013-07-16 DIAGNOSIS — H40019 Open angle with borderline findings, low risk, unspecified eye: Secondary | ICD-10-CM | POA: Diagnosis not present

## 2013-07-16 DIAGNOSIS — Z961 Presence of intraocular lens: Secondary | ICD-10-CM | POA: Diagnosis not present

## 2013-09-16 DIAGNOSIS — G609 Hereditary and idiopathic neuropathy, unspecified: Secondary | ICD-10-CM | POA: Diagnosis not present

## 2013-09-16 DIAGNOSIS — K219 Gastro-esophageal reflux disease without esophagitis: Secondary | ICD-10-CM | POA: Diagnosis not present

## 2013-09-16 DIAGNOSIS — I10 Essential (primary) hypertension: Secondary | ICD-10-CM | POA: Diagnosis not present

## 2013-09-16 DIAGNOSIS — E1149 Type 2 diabetes mellitus with other diabetic neurological complication: Secondary | ICD-10-CM | POA: Diagnosis not present

## 2013-09-16 DIAGNOSIS — E11319 Type 2 diabetes mellitus with unspecified diabetic retinopathy without macular edema: Secondary | ICD-10-CM | POA: Diagnosis not present

## 2013-09-16 DIAGNOSIS — E1139 Type 2 diabetes mellitus with other diabetic ophthalmic complication: Secondary | ICD-10-CM | POA: Diagnosis not present

## 2014-01-06 DIAGNOSIS — M1990 Unspecified osteoarthritis, unspecified site: Secondary | ICD-10-CM | POA: Diagnosis not present

## 2014-01-06 DIAGNOSIS — E11319 Type 2 diabetes mellitus with unspecified diabetic retinopathy without macular edema: Secondary | ICD-10-CM | POA: Diagnosis not present

## 2014-01-06 DIAGNOSIS — M797 Fibromyalgia: Secondary | ICD-10-CM | POA: Diagnosis not present

## 2014-01-06 DIAGNOSIS — K219 Gastro-esophageal reflux disease without esophagitis: Secondary | ICD-10-CM | POA: Diagnosis not present

## 2014-01-06 DIAGNOSIS — Z23 Encounter for immunization: Secondary | ICD-10-CM | POA: Diagnosis not present

## 2014-01-06 DIAGNOSIS — E1140 Type 2 diabetes mellitus with diabetic neuropathy, unspecified: Secondary | ICD-10-CM | POA: Diagnosis not present

## 2014-01-06 DIAGNOSIS — I10 Essential (primary) hypertension: Secondary | ICD-10-CM | POA: Diagnosis not present

## 2014-01-15 DIAGNOSIS — H40013 Open angle with borderline findings, low risk, bilateral: Secondary | ICD-10-CM | POA: Diagnosis not present

## 2014-01-15 DIAGNOSIS — Z961 Presence of intraocular lens: Secondary | ICD-10-CM | POA: Diagnosis not present

## 2014-01-22 DIAGNOSIS — M19049 Primary osteoarthritis, unspecified hand: Secondary | ICD-10-CM | POA: Diagnosis not present

## 2014-01-22 DIAGNOSIS — M5417 Radiculopathy, lumbosacral region: Secondary | ICD-10-CM | POA: Diagnosis not present

## 2014-01-22 DIAGNOSIS — M545 Low back pain: Secondary | ICD-10-CM | POA: Diagnosis not present

## 2014-01-22 DIAGNOSIS — M1990 Unspecified osteoarthritis, unspecified site: Secondary | ICD-10-CM | POA: Diagnosis not present

## 2014-01-28 DIAGNOSIS — M545 Low back pain: Secondary | ICD-10-CM | POA: Diagnosis not present

## 2014-01-30 DIAGNOSIS — M545 Low back pain: Secondary | ICD-10-CM | POA: Diagnosis not present

## 2014-02-04 DIAGNOSIS — M545 Low back pain: Secondary | ICD-10-CM | POA: Diagnosis not present

## 2014-02-06 DIAGNOSIS — M545 Low back pain: Secondary | ICD-10-CM | POA: Diagnosis not present

## 2014-02-11 DIAGNOSIS — M545 Low back pain: Secondary | ICD-10-CM | POA: Diagnosis not present

## 2014-02-18 DIAGNOSIS — M545 Low back pain: Secondary | ICD-10-CM | POA: Diagnosis not present

## 2014-02-19 DIAGNOSIS — H40013 Open angle with borderline findings, low risk, bilateral: Secondary | ICD-10-CM | POA: Diagnosis not present

## 2014-02-19 DIAGNOSIS — Z961 Presence of intraocular lens: Secondary | ICD-10-CM | POA: Diagnosis not present

## 2014-02-21 DIAGNOSIS — M545 Low back pain: Secondary | ICD-10-CM | POA: Diagnosis not present

## 2014-05-12 ENCOUNTER — Other Ambulatory Visit: Payer: Self-pay | Admitting: Internal Medicine

## 2014-05-12 DIAGNOSIS — Z1239 Encounter for other screening for malignant neoplasm of breast: Secondary | ICD-10-CM | POA: Diagnosis not present

## 2014-05-12 DIAGNOSIS — Z23 Encounter for immunization: Secondary | ICD-10-CM | POA: Diagnosis not present

## 2014-05-12 DIAGNOSIS — Z1231 Encounter for screening mammogram for malignant neoplasm of breast: Secondary | ICD-10-CM

## 2014-05-12 DIAGNOSIS — E11319 Type 2 diabetes mellitus with unspecified diabetic retinopathy without macular edema: Secondary | ICD-10-CM | POA: Diagnosis not present

## 2014-05-12 DIAGNOSIS — I10 Essential (primary) hypertension: Secondary | ICD-10-CM | POA: Diagnosis not present

## 2014-05-12 DIAGNOSIS — E1140 Type 2 diabetes mellitus with diabetic neuropathy, unspecified: Secondary | ICD-10-CM | POA: Diagnosis not present

## 2014-05-20 ENCOUNTER — Ambulatory Visit
Admission: RE | Admit: 2014-05-20 | Discharge: 2014-05-20 | Disposition: A | Discharge status: Home / Self Care | Payer: Medicare Other | Source: Ambulatory Visit | Attending: Internal Medicine | Admitting: Internal Medicine

## 2014-05-20 DIAGNOSIS — Z1231 Encounter for screening mammogram for malignant neoplasm of breast: Secondary | ICD-10-CM

## 2014-05-21 ENCOUNTER — Other Ambulatory Visit: Payer: Self-pay | Admitting: Internal Medicine

## 2014-05-21 DIAGNOSIS — R928 Other abnormal and inconclusive findings on diagnostic imaging of breast: Secondary | ICD-10-CM

## 2014-05-27 ENCOUNTER — Other Ambulatory Visit: Payer: Self-pay | Admitting: Internal Medicine

## 2014-05-27 ENCOUNTER — Ambulatory Visit
Admission: RE | Admit: 2014-05-27 | Discharge: 2014-05-27 | Disposition: A | Discharge status: Home / Self Care | Payer: Medicare Other | Source: Ambulatory Visit | Attending: Internal Medicine | Admitting: Internal Medicine

## 2014-05-27 DIAGNOSIS — R928 Other abnormal and inconclusive findings on diagnostic imaging of breast: Secondary | ICD-10-CM

## 2014-07-02 ENCOUNTER — Ambulatory Visit (INDEPENDENT_AMBULATORY_CARE_PROVIDER_SITE_OTHER): Payer: Medicare Other | Admitting: Ophthalmology

## 2014-07-02 DIAGNOSIS — E11329 Type 2 diabetes mellitus with mild nonproliferative diabetic retinopathy without macular edema: Secondary | ICD-10-CM

## 2014-07-02 DIAGNOSIS — E11319 Type 2 diabetes mellitus with unspecified diabetic retinopathy without macular edema: Secondary | ICD-10-CM | POA: Diagnosis not present

## 2014-07-02 DIAGNOSIS — H35033 Hypertensive retinopathy, bilateral: Secondary | ICD-10-CM

## 2014-07-02 DIAGNOSIS — I10 Essential (primary) hypertension: Secondary | ICD-10-CM

## 2014-07-02 DIAGNOSIS — H3531 Nonexudative age-related macular degeneration: Secondary | ICD-10-CM | POA: Diagnosis not present

## 2014-07-02 DIAGNOSIS — H43813 Vitreous degeneration, bilateral: Secondary | ICD-10-CM | POA: Diagnosis not present

## 2014-07-28 DIAGNOSIS — T162XXA Foreign body in left ear, initial encounter: Secondary | ICD-10-CM | POA: Diagnosis not present

## 2014-08-22 DIAGNOSIS — H40013 Open angle with borderline findings, low risk, bilateral: Secondary | ICD-10-CM | POA: Diagnosis not present

## 2014-08-22 DIAGNOSIS — Z961 Presence of intraocular lens: Secondary | ICD-10-CM | POA: Diagnosis not present

## 2014-09-12 DIAGNOSIS — H40013 Open angle with borderline findings, low risk, bilateral: Secondary | ICD-10-CM | POA: Diagnosis not present

## 2014-09-29 DIAGNOSIS — E11319 Type 2 diabetes mellitus with unspecified diabetic retinopathy without macular edema: Secondary | ICD-10-CM | POA: Diagnosis not present

## 2014-09-29 DIAGNOSIS — M150 Primary generalized (osteo)arthritis: Secondary | ICD-10-CM | POA: Diagnosis not present

## 2014-09-29 DIAGNOSIS — I10 Essential (primary) hypertension: Secondary | ICD-10-CM | POA: Diagnosis not present

## 2014-09-29 DIAGNOSIS — R634 Abnormal weight loss: Secondary | ICD-10-CM | POA: Diagnosis not present

## 2014-09-29 DIAGNOSIS — H1132 Conjunctival hemorrhage, left eye: Secondary | ICD-10-CM | POA: Diagnosis not present

## 2014-09-29 DIAGNOSIS — M797 Fibromyalgia: Secondary | ICD-10-CM | POA: Diagnosis not present

## 2014-10-14 DIAGNOSIS — Z961 Presence of intraocular lens: Secondary | ICD-10-CM | POA: Diagnosis not present

## 2014-10-14 DIAGNOSIS — H1851 Endothelial corneal dystrophy: Secondary | ICD-10-CM | POA: Diagnosis not present

## 2014-10-14 DIAGNOSIS — H40013 Open angle with borderline findings, low risk, bilateral: Secondary | ICD-10-CM | POA: Diagnosis not present

## 2014-12-07 ENCOUNTER — Emergency Department (HOSPITAL_COMMUNITY): Payer: Medicare Other

## 2014-12-07 ENCOUNTER — Emergency Department (HOSPITAL_COMMUNITY)
Admission: EM | Admit: 2014-12-07 | Discharge: 2014-12-07 | Disposition: A | Discharge status: Home / Self Care | Payer: Medicare Other | Attending: Emergency Medicine | Admitting: Emergency Medicine

## 2014-12-07 ENCOUNTER — Encounter (HOSPITAL_COMMUNITY): Payer: Self-pay | Admitting: *Deleted

## 2014-12-07 DIAGNOSIS — Z79899 Other long term (current) drug therapy: Secondary | ICD-10-CM | POA: Diagnosis not present

## 2014-12-07 DIAGNOSIS — E119 Type 2 diabetes mellitus without complications: Secondary | ICD-10-CM | POA: Diagnosis not present

## 2014-12-07 DIAGNOSIS — R197 Diarrhea, unspecified: Secondary | ICD-10-CM | POA: Diagnosis not present

## 2014-12-07 DIAGNOSIS — I10 Essential (primary) hypertension: Secondary | ICD-10-CM | POA: Insufficient documentation

## 2014-12-07 DIAGNOSIS — K529 Noninfective gastroenteritis and colitis, unspecified: Secondary | ICD-10-CM | POA: Insufficient documentation

## 2014-12-07 DIAGNOSIS — R1032 Left lower quadrant pain: Secondary | ICD-10-CM | POA: Diagnosis not present

## 2014-12-07 DIAGNOSIS — K6389 Other specified diseases of intestine: Secondary | ICD-10-CM | POA: Diagnosis not present

## 2014-12-07 HISTORY — DX: Essential (primary) hypertension: I10

## 2014-12-07 HISTORY — DX: Type 2 diabetes mellitus without complications: E11.9

## 2014-12-07 LAB — CBC
HCT: 38.6 % (ref 36.0–46.0)
Hemoglobin: 13.1 g/dL (ref 12.0–15.0)
MCH: 32.2 pg (ref 26.0–34.0)
MCHC: 33.9 g/dL (ref 30.0–36.0)
MCV: 94.8 fL (ref 78.0–100.0)
Platelets: 202 10*3/uL (ref 150–400)
RBC: 4.07 MIL/uL (ref 3.87–5.11)
RDW: 14 % (ref 11.5–15.5)
WBC: 16.9 10*3/uL — ABNORMAL HIGH (ref 4.0–10.5)

## 2014-12-07 LAB — TYPE AND SCREEN
ABO/RH(D): O POS
Antibody Screen: NEGATIVE

## 2014-12-07 LAB — COMPREHENSIVE METABOLIC PANEL
ALT: 18 U/L (ref 14–54)
AST: 21 U/L (ref 15–41)
Albumin: 3.4 g/dL — ABNORMAL LOW (ref 3.5–5.0)
Alkaline Phosphatase: 69 U/L (ref 38–126)
Anion gap: 10 (ref 5–15)
BUN: 17 mg/dL (ref 6–20)
CO2: 26 mmol/L (ref 22–32)
Calcium: 8.8 mg/dL — ABNORMAL LOW (ref 8.9–10.3)
Chloride: 93 mmol/L — ABNORMAL LOW (ref 101–111)
Creatinine, Ser: 0.65 mg/dL (ref 0.44–1.00)
GFR calc Af Amer: >60 mL/min (ref >60)
GFR calc non Af Amer: >60 mL/min (ref >60)
Glucose, Bld: 169 mg/dL — ABNORMAL HIGH (ref 65–99)
Potassium: 4.3 mmol/L (ref 3.5–5.1)
Sodium: 129 mmol/L — ABNORMAL LOW (ref 135–145)
Total Bilirubin: 0.8 mg/dL (ref 0.3–1.2)
Total Protein: 6.8 g/dL (ref 6.5–8.1)

## 2014-12-07 LAB — C DIFFICILE QUICK SCREEN W PCR REFLEX
C Diff antigen: NEGATIVE
C Diff interpretation: NEGATIVE
C Diff toxin: NEGATIVE

## 2014-12-07 LAB — LIPASE, BLOOD: Lipase: 16 U/L — ABNORMAL LOW (ref 22–51)

## 2014-12-07 LAB — ABO/RH: ABO/RH(D): O POS

## 2014-12-07 LAB — I-STAT CG4 LACTIC ACID, ED: Lactic Acid, Venous: 1.34 mmol/L (ref 0.5–2.0)

## 2014-12-07 LAB — POC OCCULT BLOOD, ED: Fecal Occult Bld: POSITIVE — AB

## 2014-12-07 MED ORDER — IOHEXOL 300 MG/ML  SOLN
50.0000 mL | Freq: Once | INTRAMUSCULAR | Status: AC | PRN
  Administered 2014-12-07: 50 mL via ORAL

## 2014-12-07 MED ORDER — CIPROFLOXACIN HCL 500 MG PO TABS: 500.0000 mg | ORAL_TABLET | Freq: Two times a day (BID) | ORAL | Status: DC

## 2014-12-07 MED ORDER — IOHEXOL 300 MG/ML  SOLN
100.0000 mL | Freq: Once | INTRAMUSCULAR | Status: AC | PRN
  Administered 2014-12-07: 100 mL via INTRAVENOUS

## 2014-12-07 MED ORDER — METRONIDAZOLE 500 MG PO TABS: 500.0000 mg | ORAL_TABLET | Freq: Three times a day (TID) | ORAL | Status: DC

## 2014-12-07 MED ORDER — CIPROFLOXACIN HCL 500 MG PO TABS
500.0000 mg | ORAL_TABLET | Freq: Once | ORAL | Status: AC
  Administered 2014-12-07: 500 mg via ORAL
  Filled 2014-12-07: qty 1

## 2014-12-07 MED ORDER — METRONIDAZOLE 500 MG PO TABS
500.0000 mg | ORAL_TABLET | Freq: Once | ORAL | Status: AC
  Administered 2014-12-07: 500 mg via ORAL
  Filled 2014-12-07: qty 1

## 2014-12-07 NOTE — Discharge Instructions (Signed)

## 2014-12-07 NOTE — ED Provider Notes (Signed)
CSN: [id number]     Arrival date & time 12/07/14  1340 History   First MD Initiated Contact with Patient 12/07/14 1514     Chief Complaint  Patient presents with  . Diarrhea     (Consider location/radiation/quality/duration/timing/severity/associated sxs/prior Treatment) HPI Comments: Patient presents to the ER for evaluation of abdominal pain and diarrhea. Patient has been experiencing symptoms for 3 days. She reports that she had been having excruciating pain in the lower abdomen several days ago, but now it is only "sore". She reports that the stool has been "chocolatey" and "it's stinks". She has not had any recent antibiotic's or travel.  Patient is a 81 y.o. female presenting with diarrhea.  Diarrhea Associated symptoms: abdominal pain     Past Medical History  Diagnosis Date  . Diabetes mellitus without complication   . Hypertension    Past Surgical History  Procedure Laterality Date  . Cholecystectomy    . Abdominal hysterectomy     No family history on file. Social History  Substance Use Topics  . Smoking status: Never Smoker   . Smokeless tobacco: None  . Alcohol Use: No   OB History    No data available     Review of Systems  Gastrointestinal: Positive for abdominal pain, diarrhea and blood in stool.  All other systems reviewed and are negative.     Allergies  Review of patient's allergies indicates no known allergies.  Home Medications   Prior to Admission medications   Medication Sig Start Date End Date Taking? Authorizing Provider  latanoprost (XALATAN) 0.005 % ophthalmic solution Place 1 drop into both eyes at bedtime.  11/05/14  Yes Historical Provider, MD  LYRICA 50 MG capsule Take 50 mg by mouth 2 (two) times daily.  12/02/14  Yes Historical Provider, MD  ciprofloxacin (CIPRO) 500 MG tablet Take 1 tablet (500 mg total) by mouth 2 (two) times daily. 12/07/14   Orpah Greek, MD  metroNIDAZOLE (FLAGYL) 500 MG tablet Take 1 tablet (500 mg  total) by mouth 3 (three) times daily. 12/07/14   Orpah Greek, MD  quinapril (ACCUPRIL) 20 MG tablet Take 20 mg by mouth daily.  10/10/14   Historical Provider, MD  traMADol (ULTRAM) 50 MG tablet Take 50 mg by mouth every 6 (six) hours as needed for moderate pain or severe pain.  12/02/14   Historical Provider, MD   BP 129/72 mmHg  Pulse 105  Temp(Src) 98.2 F (36.8 C) (Oral)  Resp 18  SpO2 100% Physical Exam  Constitutional: She is oriented to person, place, and time. She appears well-developed and well-nourished. No distress.  HENT:  Head: Normocephalic and atraumatic.  Right Ear: Hearing normal.  Left Ear: Hearing normal.  Nose: Nose normal.  Mouth/Throat: Oropharynx is clear and moist and mucous membranes are normal.  Eyes: Conjunctivae and EOM are normal. Pupils are equal, round, and reactive to light.  Neck: Normal range of motion. Neck supple.  Cardiovascular: Regular rhythm, S1 normal and S2 normal.  Exam reveals no gallop and no friction rub.   No murmur heard. Pulmonary/Chest: Effort normal and breath sounds normal. No respiratory distress. She exhibits no tenderness.  Abdominal: Soft. Normal appearance and bowel sounds are normal. There is no hepatosplenomegaly. There is tenderness in the left lower quadrant. There is no rebound, no guarding, no tenderness at McBurney's point and negative Murphy's sign. No hernia.  Musculoskeletal: Normal range of motion.  Neurological: She is alert and oriented to person, place, and time. She  has normal strength. No cranial nerve deficit or sensory deficit. Coordination normal. GCS eye subscore is 4. GCS verbal subscore is 5. GCS motor subscore is 6.  Skin: Skin is warm, dry and intact. No rash noted. No cyanosis.  Psychiatric: She has a normal mood and affect. Her speech is normal and behavior is normal. Thought content normal.  Nursing note and vitals reviewed.   ED Course  Procedures (including critical care time) Labs  Review Labs Reviewed  LIPASE, BLOOD - Abnormal; Notable for the following:    Lipase 16 (*)    All other components within normal limits  COMPREHENSIVE METABOLIC PANEL - Abnormal; Notable for the following:    Sodium 129 (*)    Chloride 93 (*)    Glucose, Bld 169 (*)    Calcium 8.8 (*)    Albumin 3.4 (*)    All other components within normal limits  CBC - Abnormal; Notable for the following:    WBC 16.9 (*)    All other components within normal limits  POC OCCULT BLOOD, ED - Abnormal; Notable for the following:    Fecal Occult Bld POSITIVE (*)    All other components within normal limits  C DIFFICILE QUICK SCREEN W PCR REFLEX  STOOL CULTURE  URINALYSIS, ROUTINE W REFLEX MICROSCOPIC (NOT AT [facility])  I-STAT CG4 LACTIC ACID, ED  TYPE AND SCREEN  ABO/RH    Imaging Review Ct Abdomen Pelvis W Contrast  12/07/2014   CLINICAL DATA:  Blood-tinged brownish diarrhea for 3 days. Decreased appetite with abdominal soreness. Initial encounter.  EXAM: CT ABDOMEN AND PELVIS WITH CONTRAST  TECHNIQUE: Multidetector CT imaging of the abdomen and pelvis was performed using the standard protocol following bolus administration of intravenous contrast.  CONTRAST:  171mL OMNIPAQUE IOHEXOL 300 MG/ML  SOLN  COMPARISON:  Limited correlation made with CT lumbar myelogram 04/16/2008.  FINDINGS: Lower chest: Mild linear atelectasis at both lung bases. No significant pleural or pericardial effusion.  Hepatobiliary: Focal fat adjacent to the falciform ligament. No suspicious hepatic findings. No significant biliary dilatation status post cholecystectomy.  Pancreas: Unremarkable. No pancreatic ductal dilatation or surrounding inflammatory changes.  Spleen: Small calcified granulomas. The spleen is otherwise normal in size and appearance.  Adrenals/Urinary Tract: Both adrenal glands appear normal. There are small bilateral renal cysts, including parapelvic ones on the left. No evidence of renal mass, urinary tract calculus  or hydronephrosis. The urinary bladder is moderately distended without apparent focal abnormality.  Stomach/Bowel: The stomach, small bowel and appendix appear normal. There is colonic wall thickening extending from the splenic flexure to the rectum with surrounding inflammatory change consistent with colitis. No evidence of bowel obstruction or perforation.  Vascular/Lymphatic: There are no enlarged abdominal or pelvic lymph nodes. Mild aortoiliac atherosclerosis. No evidence of large vessel occlusion.  Reproductive: Hysterectomy.  No evidence of adnexal mass.  Other: No evidence of abdominal wall mass or hernia.  Musculoskeletal: No acute osseous findings. There are advanced degenerative changes throughout the lumbar spine associated with a scoliosis.  IMPRESSION: 1. Diffuse wall thickening of the colon extending from the splenic flexure through the rectum consistent with colitis. This pattern can be seen with infection (including C difficile colitis) and ulcerative colitis. No evidence of bowel obstruction or perforation. 2. Atherosclerosis without evidence of large vessel occlusion. 3. Renal cortical and parapelvic cysts. 4. Diffuse lumbar spondylosis.   Electronically Signed   By: Richardean Sale M.D.   On: 12/07/2014 18:24   I have personally reviewed and evaluated  these images and lab results as part of my medical decision-making.   EKG Interpretation None      MDM   Final diagnoses:  Colitis    Patient presents to the ER for evaluation of left-sided abdominal pain with diarrhea that has been mixed with blood. Patient has mild left lower quadrant tenderness but no guarding or rebound. She is not expressing any pain without palpation. She is afebrile. Physical: Was positive, but hemoglobin is stable and normal. Lab work was otherwise unremarkable. CT scan performed and does confirm evidence of colitis. Etiology is unclear at this time. Will treat for infectious with Cipro and Flagyl. Patient  reports that she has tramadol at home that she will use for pain. Pain is well-controlled here without any pain medication. Patient has been seen at [facility] gastroenterology in the past. She was counseled to call in the morning for follow-up. She was told to come back to the ER immediately for any fever, worsening pain or worsening bleeding.    Orpah Greek, MD 12/07/14 [phone]

## 2014-12-07 NOTE — ED Notes (Signed)
Pt went to restroom was not able to give stool sample or UA.

## 2014-12-07 NOTE — ED Notes (Addendum)
Pt reports blood tinged brownish diarrhea since Thursday.  Probably had about 20 diarrhea today with decreased appetite.  Pt also reports abd soreness.  Denies recent use of abx or recent admission to a hospital

## 2014-12-11 LAB — STOOL CULTURE

## 2014-12-18 ENCOUNTER — Other Ambulatory Visit: Payer: Self-pay | Admitting: Gastroenterology

## 2015-02-19 ENCOUNTER — Encounter (HOSPITAL_COMMUNITY): Payer: Self-pay | Admitting: *Deleted

## 2015-03-02 ENCOUNTER — Ambulatory Visit (HOSPITAL_COMMUNITY)
Admission: RE | Admit: 2015-03-02 | Discharge: 2015-03-02 | Disposition: A | Discharge status: Home / Self Care | Payer: Medicare Other | Source: Ambulatory Visit | Attending: Gastroenterology | Admitting: Gastroenterology

## 2015-03-02 ENCOUNTER — Encounter (HOSPITAL_COMMUNITY): Payer: Self-pay | Admitting: *Deleted

## 2015-03-02 ENCOUNTER — Ambulatory Visit (HOSPITAL_COMMUNITY): Payer: Medicare Other | Admitting: Anesthesiology

## 2015-03-02 ENCOUNTER — Encounter (HOSPITAL_COMMUNITY)
Admission: RE | Disposition: A | Discharge status: Home / Self Care | Payer: Self-pay | Source: Ambulatory Visit | Attending: Gastroenterology

## 2015-03-02 DIAGNOSIS — I10 Essential (primary) hypertension: Secondary | ICD-10-CM | POA: Insufficient documentation

## 2015-03-02 DIAGNOSIS — E11319 Type 2 diabetes mellitus with unspecified diabetic retinopathy without macular edema: Secondary | ICD-10-CM | POA: Insufficient documentation

## 2015-03-02 DIAGNOSIS — R195 Other fecal abnormalities: Secondary | ICD-10-CM | POA: Insufficient documentation

## 2015-03-02 DIAGNOSIS — M797 Fibromyalgia: Secondary | ICD-10-CM | POA: Diagnosis not present

## 2015-03-02 DIAGNOSIS — M4806 Spinal stenosis, lumbar region: Secondary | ICD-10-CM | POA: Insufficient documentation

## 2015-03-02 DIAGNOSIS — Z1211 Encounter for screening for malignant neoplasm of colon: Secondary | ICD-10-CM | POA: Diagnosis not present

## 2015-03-02 DIAGNOSIS — M1990 Unspecified osteoarthritis, unspecified site: Secondary | ICD-10-CM | POA: Diagnosis not present

## 2015-03-02 DIAGNOSIS — E119 Type 2 diabetes mellitus without complications: Secondary | ICD-10-CM | POA: Diagnosis not present

## 2015-03-02 DIAGNOSIS — K529 Noninfective gastroenteritis and colitis, unspecified: Secondary | ICD-10-CM | POA: Diagnosis not present

## 2015-03-02 HISTORY — DX: Malignant (primary) neoplasm, unspecified: C80.1

## 2015-03-02 HISTORY — DX: Polyneuropathy, unspecified: G62.9

## 2015-03-02 HISTORY — PX: COLONOSCOPY WITH PROPOFOL: SHX5780

## 2015-03-02 LAB — GLUCOSE, CAPILLARY: Glucose-Capillary: 127 mg/dL — ABNORMAL HIGH (ref 65–99)

## 2015-03-02 SURGERY — COLONOSCOPY WITH PROPOFOL
Anesthesia: Monitor Anesthesia Care

## 2015-03-02 MED ORDER — PROPOFOL 10 MG/ML IV BOLUS
INTRAVENOUS | Status: AC
  Filled 2015-03-02: qty 40

## 2015-03-02 MED ORDER — PROPOFOL 10 MG/ML IV BOLUS
INTRAVENOUS | Status: DC | PRN
  Administered 2015-03-02 (×3): 20 mg via INTRAVENOUS

## 2015-03-02 MED ORDER — SODIUM CHLORIDE 0.9 % IV SOLN
INTRAVENOUS | Status: DC
Start: 2015-03-02 — End: 2015-03-02

## 2015-03-02 MED ORDER — LACTATED RINGERS IV SOLN
INTRAVENOUS | Status: DC
  Administered 2015-03-02: 1000 mL via INTRAVENOUS

## 2015-03-02 MED ORDER — PROPOFOL 500 MG/50ML IV EMUL
INTRAVENOUS | Status: DC | PRN
  Administered 2015-03-02: 140 ug/kg/min via INTRAVENOUS

## 2015-03-02 SURGICAL SUPPLY — 21 items

## 2015-03-02 NOTE — Anesthesia Preprocedure Evaluation (Addendum)
Anesthesia Evaluation  Patient identified by MRN, date of birth, ID band Patient awake    Reviewed: Allergy & Precautions, H&P , NPO status , Patient's Chart, lab work & pertinent test results  Airway Mallampati: II  TM Distance: >3 FB Neck ROM: full    Dental no notable dental hx. (+) Dental Advisory Given, Teeth Intact   Pulmonary neg pulmonary ROS,    Pulmonary exam normal breath sounds clear to auscultation       Cardiovascular Exercise Tolerance: Good hypertension, Pt. on medications Normal cardiovascular exam Rhythm:regular Rate:Normal     Neuro/Psych negative neurological ROS  negative psych ROS   GI/Hepatic negative GI ROS, Neg liver ROS,   Endo/Other  diabetes, Well Controlled, Type 2  Renal/GU negative Renal ROS  negative genitourinary   Musculoskeletal   Abdominal   Peds  Hematology negative hematology ROS (+)   Anesthesia Other Findings   Reproductive/Obstetrics negative OB ROS                            Anesthesia Physical Anesthesia Plan  ASA: III  Anesthesia Plan: MAC   Post-op Pain Management:    Induction:   Airway Management Planned:   Additional Equipment:   Intra-op Plan:   Post-operative Plan:   Informed Consent: I have reviewed the patients History and Physical, chart, labs and discussed the procedure including the risks, benefits and alternatives for the proposed anesthesia with the patient or authorized representative who has indicated his/her understanding and acceptance.   Dental Advisory Given  Plan Discussed with: CRNA and Surgeon  Anesthesia Plan Comments:         Anesthesia Quick Evaluation

## 2015-03-02 NOTE — Transfer of Care (Signed)
Immediate Anesthesia Transfer of Care Note  Patient: [name]  Procedure(s) Performed: Procedure(s): COLONOSCOPY WITH PROPOFOL (N/A)  Patient Location: Endoscopy Unit  Anesthesia Type:MAC  Level of Consciousness: awake  Airway & Oxygen Therapy: Patient Spontanous Breathing and Patient connected to nasal cannula oxygen  Post-op Assessment: Report given to RN and Post -op Vital signs reviewed and stable  Post vital signs: Reviewed and stable  Last Vitals:  Filed Vitals:   03/02/15 0718  BP: 123/63  Pulse: 104  Temp: 36.8 C  Resp: 14    Complications: No apparent anesthesia complications

## 2015-03-02 NOTE — H&P (Signed)
  Procedure: Diagnostic colonoscopy post presumed acute ischemic colitis  History: The patient is a 81 year old female born 26-Aug-1933. On 12/07/2014 the patient was evaluated in the [facility] emergency room with abdominal pain and diarrhea. White blood cell count was 16,900. Hemoglobin was 13.1 g. Platelet count was 202,000. Stool screen for C. difficile toxin was negative. Stool was heme positive. Stool culture was negative for enteric pathogens. Serum lipase was normal. Complete metabolic profile was abnormal for serum sodium 129 and serum glucose 169. The patient was discharged from the emergency room on ciprofloxacin and metronidazole. 12/07/2014 CT scan of the abdomen and pelvis showed diffuse wall thickening of the colon extending from the splenic flexure through the rectum consistent with colitis. Aortic atherosclerosis was present.  The patient underwent a normal screening colonoscopy on 09/08/2000. Left colonic diverticulosis was present.  The patient feels well. She reports no recurrent abdominal pain or diarrhea.  She is scheduled to undergo a diagnostic colonoscopy.  Past medical history: Type 2 diabetes mellitus with retinopathy. Hypertension. Osteoarthritis. Lumbar spinal stenosis. Laminectomy. Fibromyalgia syndrome. Total abdominal hysterectomy with bilateral salpingo-oophorectomy. Cholecystectomy. Left kidney cyst removed surgically.  Medication allergies: Codeine  Exam: The patient is alert and lying comfortably on the endoscopy stretcher. Abdomen is soft and nontender to palpation. Lungs are clear to auscultation. Cardiac exam reveals a regular rhythm.  Plan: Proceed with diagnostic colonoscopy.

## 2015-03-02 NOTE — Anesthesia Postprocedure Evaluation (Signed)
Anesthesia Post Note  Patient: [name]  Procedure(s) Performed: Procedure(s) (LRB): COLONOSCOPY WITH PROPOFOL (N/A)  Patient location during evaluation: PACU Anesthesia Type: MAC Level of consciousness: awake and alert Pain management: pain level controlled Vital Signs Assessment: post-procedure vital signs reviewed and stable Respiratory status: spontaneous breathing, nonlabored ventilation, respiratory function stable and patient connected to nasal cannula oxygen Cardiovascular status: blood pressure returned to baseline and stable Postop Assessment: no signs of nausea or vomiting Anesthetic complications: no    Last Vitals:  Filed Vitals:   03/02/15 0855 03/02/15 0900  BP:  130/84  Pulse: 92 96  Temp:    Resp: 16 14    Last Pain: There were no vitals filed for this visit.               Macguire Holsinger L

## 2015-03-02 NOTE — Discharge Instructions (Signed)

## 2015-03-02 NOTE — Op Note (Signed)
Procedure: Diagnostic colonoscopy following treatment for presumed acute ischemic colitis in September 2016. Normal screening colonoscopy performed on 09/08/2000. Heme positive stool in September 2016.  Endoscopist: Earle Gell  Premedication: Propofol administered by anesthesia  Procedure: The patient was placed in the left lateral decubitus position. Anal inspection and digital rectal exam were normal. The Pentax pediatric colonoscope was introduced into the rectum and advanced to the cecum. A normal-appearing appendiceal orifice and ileocecal valve were identified. Colonic preparation for the exam today was good. Withdrawal time was 9 minutes  Rectum. Normal. Retroflexed view of the distal rectum was normal  Sigmoid colon and descending colon. Normal  Splenic flexure. Normal  Transverse colon. Normal  Hepatic flexure. Normal  Ascending colon. Normal  Cecum and ileocecal valve. Normal  Assessment: Normal colonoscopy post treatment for acute ischemic colitis (12/07/2014 CT scan of the abdomen and pelvis showed diffuse wall thickening of the colon extending from the splenic flexure through the rectum consistent with colitis).

## 2015-03-03 ENCOUNTER — Encounter (HOSPITAL_COMMUNITY): Payer: Self-pay | Admitting: Gastroenterology

## 2015-03-26 ENCOUNTER — Other Ambulatory Visit: Payer: Self-pay

## 2015-03-26 DIAGNOSIS — Z1231 Encounter for screening mammogram for malignant neoplasm of breast: Secondary | ICD-10-CM

## 2015-04-07 DIAGNOSIS — F321 Major depressive disorder, single episode, moderate: Secondary | ICD-10-CM | POA: Diagnosis not present

## 2015-04-07 DIAGNOSIS — E1140 Type 2 diabetes mellitus with diabetic neuropathy, unspecified: Secondary | ICD-10-CM | POA: Diagnosis not present

## 2015-04-07 DIAGNOSIS — E11319 Type 2 diabetes mellitus with unspecified diabetic retinopathy without macular edema: Secondary | ICD-10-CM | POA: Diagnosis not present

## 2015-04-07 DIAGNOSIS — I10 Essential (primary) hypertension: Secondary | ICD-10-CM | POA: Diagnosis not present

## 2015-04-07 DIAGNOSIS — R636 Underweight: Secondary | ICD-10-CM | POA: Diagnosis not present

## 2015-04-07 DIAGNOSIS — M1990 Unspecified osteoarthritis, unspecified site: Secondary | ICD-10-CM | POA: Diagnosis not present

## 2015-04-07 DIAGNOSIS — M797 Fibromyalgia: Secondary | ICD-10-CM | POA: Diagnosis not present

## 2015-04-07 DIAGNOSIS — Z0000 Encounter for general adult medical examination without abnormal findings: Secondary | ICD-10-CM | POA: Diagnosis not present

## 2015-04-07 DIAGNOSIS — Z1389 Encounter for screening for other disorder: Secondary | ICD-10-CM | POA: Diagnosis not present

## 2015-04-16 DIAGNOSIS — H40012 Open angle with borderline findings, low risk, left eye: Secondary | ICD-10-CM | POA: Diagnosis not present

## 2015-04-16 DIAGNOSIS — H353131 Nonexudative age-related macular degeneration, bilateral, early dry stage: Secondary | ICD-10-CM | POA: Diagnosis not present

## 2015-04-16 DIAGNOSIS — H401111 Primary open-angle glaucoma, right eye, mild stage: Secondary | ICD-10-CM | POA: Diagnosis not present

## 2015-04-16 DIAGNOSIS — H1851 Endothelial corneal dystrophy: Secondary | ICD-10-CM | POA: Diagnosis not present

## 2015-05-21 ENCOUNTER — Ambulatory Visit
Admission: RE | Admit: 2015-05-21 | Discharge: 2015-05-21 | Disposition: A | Discharge status: Home / Self Care | Payer: Medicare Other | Source: Ambulatory Visit

## 2015-05-21 DIAGNOSIS — Z1231 Encounter for screening mammogram for malignant neoplasm of breast: Secondary | ICD-10-CM

## 2015-07-02 ENCOUNTER — Ambulatory Visit (INDEPENDENT_AMBULATORY_CARE_PROVIDER_SITE_OTHER): Payer: Medicare Other | Admitting: Ophthalmology

## 2015-07-09 ENCOUNTER — Ambulatory Visit (INDEPENDENT_AMBULATORY_CARE_PROVIDER_SITE_OTHER): Payer: Medicare Other | Admitting: Ophthalmology

## 2015-07-09 DIAGNOSIS — H43813 Vitreous degeneration, bilateral: Secondary | ICD-10-CM

## 2015-07-09 DIAGNOSIS — E11319 Type 2 diabetes mellitus with unspecified diabetic retinopathy without macular edema: Secondary | ICD-10-CM | POA: Diagnosis not present

## 2015-07-09 DIAGNOSIS — I10 Essential (primary) hypertension: Secondary | ICD-10-CM

## 2015-07-09 DIAGNOSIS — H353111 Nonexudative age-related macular degeneration, right eye, early dry stage: Secondary | ICD-10-CM

## 2015-07-09 DIAGNOSIS — E113293 Type 2 diabetes mellitus with mild nonproliferative diabetic retinopathy without macular edema, bilateral: Secondary | ICD-10-CM | POA: Diagnosis not present

## 2015-07-09 DIAGNOSIS — H35033 Hypertensive retinopathy, bilateral: Secondary | ICD-10-CM

## 2015-07-09 DIAGNOSIS — H353122 Nonexudative age-related macular degeneration, left eye, intermediate dry stage: Secondary | ICD-10-CM | POA: Diagnosis not present

## 2015-09-25 DIAGNOSIS — D1801 Hemangioma of skin and subcutaneous tissue: Secondary | ICD-10-CM | POA: Diagnosis not present

## 2015-09-25 DIAGNOSIS — L570 Actinic keratosis: Secondary | ICD-10-CM | POA: Diagnosis not present

## 2015-10-14 DIAGNOSIS — H35371 Puckering of macula, right eye: Secondary | ICD-10-CM | POA: Diagnosis not present

## 2015-10-14 DIAGNOSIS — H401112 Primary open-angle glaucoma, right eye, moderate stage: Secondary | ICD-10-CM | POA: Diagnosis not present

## 2015-10-14 DIAGNOSIS — H40012 Open angle with borderline findings, low risk, left eye: Secondary | ICD-10-CM | POA: Diagnosis not present

## 2015-10-14 DIAGNOSIS — Z961 Presence of intraocular lens: Secondary | ICD-10-CM | POA: Diagnosis not present

## 2015-10-14 DIAGNOSIS — H1851 Endothelial corneal dystrophy: Secondary | ICD-10-CM | POA: Diagnosis not present

## 2016-01-13 DIAGNOSIS — E11319 Type 2 diabetes mellitus with unspecified diabetic retinopathy without macular edema: Secondary | ICD-10-CM | POA: Diagnosis not present

## 2016-01-13 DIAGNOSIS — I10 Essential (primary) hypertension: Secondary | ICD-10-CM | POA: Diagnosis not present

## 2016-01-13 DIAGNOSIS — F321 Major depressive disorder, single episode, moderate: Secondary | ICD-10-CM | POA: Diagnosis not present

## 2016-01-13 DIAGNOSIS — M797 Fibromyalgia: Secondary | ICD-10-CM | POA: Diagnosis not present

## 2016-01-13 DIAGNOSIS — Z23 Encounter for immunization: Secondary | ICD-10-CM | POA: Diagnosis not present

## 2016-01-13 DIAGNOSIS — K219 Gastro-esophageal reflux disease without esophagitis: Secondary | ICD-10-CM | POA: Diagnosis not present

## 2016-01-13 DIAGNOSIS — Z780 Asymptomatic menopausal state: Secondary | ICD-10-CM | POA: Diagnosis not present

## 2016-01-13 DIAGNOSIS — M859 Disorder of bone density and structure, unspecified: Secondary | ICD-10-CM | POA: Diagnosis not present

## 2016-01-13 DIAGNOSIS — M1990 Unspecified osteoarthritis, unspecified site: Secondary | ICD-10-CM | POA: Diagnosis not present

## 2016-02-10 DIAGNOSIS — M810 Age-related osteoporosis without current pathological fracture: Secondary | ICD-10-CM | POA: Diagnosis not present

## 2016-02-10 DIAGNOSIS — M8589 Other specified disorders of bone density and structure, multiple sites: Secondary | ICD-10-CM | POA: Diagnosis not present

## 2016-04-04 DIAGNOSIS — M797 Fibromyalgia: Secondary | ICD-10-CM | POA: Diagnosis not present

## 2016-04-04 DIAGNOSIS — E11319 Type 2 diabetes mellitus with unspecified diabetic retinopathy without macular edema: Secondary | ICD-10-CM | POA: Diagnosis not present

## 2016-04-04 DIAGNOSIS — G629 Polyneuropathy, unspecified: Secondary | ICD-10-CM | POA: Diagnosis not present

## 2016-04-04 DIAGNOSIS — Z7984 Long term (current) use of oral hypoglycemic drugs: Secondary | ICD-10-CM | POA: Diagnosis not present

## 2016-04-04 DIAGNOSIS — I10 Essential (primary) hypertension: Secondary | ICD-10-CM | POA: Diagnosis not present

## 2016-04-11 DIAGNOSIS — E11319 Type 2 diabetes mellitus with unspecified diabetic retinopathy without macular edema: Secondary | ICD-10-CM | POA: Diagnosis not present

## 2016-04-11 DIAGNOSIS — G629 Polyneuropathy, unspecified: Secondary | ICD-10-CM | POA: Diagnosis not present

## 2016-04-11 DIAGNOSIS — Z7984 Long term (current) use of oral hypoglycemic drugs: Secondary | ICD-10-CM | POA: Diagnosis not present

## 2016-04-11 DIAGNOSIS — M797 Fibromyalgia: Secondary | ICD-10-CM | POA: Diagnosis not present

## 2016-04-11 DIAGNOSIS — I10 Essential (primary) hypertension: Secondary | ICD-10-CM | POA: Diagnosis not present

## 2016-04-19 DIAGNOSIS — H401113 Primary open-angle glaucoma, right eye, severe stage: Secondary | ICD-10-CM | POA: Diagnosis not present

## 2016-04-19 DIAGNOSIS — E119 Type 2 diabetes mellitus without complications: Secondary | ICD-10-CM | POA: Diagnosis not present

## 2016-04-19 DIAGNOSIS — H10413 Chronic giant papillary conjunctivitis, bilateral: Secondary | ICD-10-CM | POA: Diagnosis not present

## 2016-04-19 DIAGNOSIS — Z961 Presence of intraocular lens: Secondary | ICD-10-CM | POA: Diagnosis not present

## 2016-04-19 DIAGNOSIS — H353131 Nonexudative age-related macular degeneration, bilateral, early dry stage: Secondary | ICD-10-CM | POA: Diagnosis not present

## 2016-04-19 DIAGNOSIS — H35371 Puckering of macula, right eye: Secondary | ICD-10-CM | POA: Diagnosis not present

## 2016-04-19 DIAGNOSIS — H11123 Conjunctival concretions, bilateral: Secondary | ICD-10-CM | POA: Diagnosis not present

## 2016-04-19 DIAGNOSIS — H401122 Primary open-angle glaucoma, left eye, moderate stage: Secondary | ICD-10-CM | POA: Diagnosis not present

## 2016-04-22 ENCOUNTER — Other Ambulatory Visit: Payer: Self-pay | Admitting: Family Medicine

## 2016-04-22 DIAGNOSIS — Z1231 Encounter for screening mammogram for malignant neoplasm of breast: Secondary | ICD-10-CM

## 2016-05-25 ENCOUNTER — Ambulatory Visit
Admission: RE | Admit: 2016-05-25 | Discharge: 2016-05-25 | Disposition: A | Discharge status: Home / Self Care | Payer: Medicare Other | Source: Ambulatory Visit | Attending: Family Medicine | Admitting: Family Medicine

## 2016-05-25 DIAGNOSIS — Z1231 Encounter for screening mammogram for malignant neoplasm of breast: Secondary | ICD-10-CM | POA: Diagnosis not present

## 2016-07-04 DIAGNOSIS — W57XXXA Bitten or stung by nonvenomous insect and other nonvenomous arthropods, initial encounter: Secondary | ICD-10-CM | POA: Diagnosis not present

## 2016-07-04 DIAGNOSIS — D692 Other nonthrombocytopenic purpura: Secondary | ICD-10-CM | POA: Diagnosis not present

## 2016-07-04 DIAGNOSIS — L570 Actinic keratosis: Secondary | ICD-10-CM | POA: Diagnosis not present

## 2016-07-08 ENCOUNTER — Ambulatory Visit (INDEPENDENT_AMBULATORY_CARE_PROVIDER_SITE_OTHER): Payer: Medicare Other | Admitting: Ophthalmology

## 2016-07-08 DIAGNOSIS — H353122 Nonexudative age-related macular degeneration, left eye, intermediate dry stage: Secondary | ICD-10-CM

## 2016-07-08 DIAGNOSIS — H353111 Nonexudative age-related macular degeneration, right eye, early dry stage: Secondary | ICD-10-CM | POA: Diagnosis not present

## 2016-07-08 DIAGNOSIS — E11319 Type 2 diabetes mellitus with unspecified diabetic retinopathy without macular edema: Secondary | ICD-10-CM

## 2016-07-08 DIAGNOSIS — H43813 Vitreous degeneration, bilateral: Secondary | ICD-10-CM | POA: Diagnosis not present

## 2016-07-08 DIAGNOSIS — E113291 Type 2 diabetes mellitus with mild nonproliferative diabetic retinopathy without macular edema, right eye: Secondary | ICD-10-CM

## 2016-07-08 DIAGNOSIS — I10 Essential (primary) hypertension: Secondary | ICD-10-CM | POA: Diagnosis not present

## 2016-07-08 DIAGNOSIS — H35033 Hypertensive retinopathy, bilateral: Secondary | ICD-10-CM | POA: Diagnosis not present

## 2016-07-19 DIAGNOSIS — H401122 Primary open-angle glaucoma, left eye, moderate stage: Secondary | ICD-10-CM | POA: Diagnosis not present

## 2016-07-19 DIAGNOSIS — H401113 Primary open-angle glaucoma, right eye, severe stage: Secondary | ICD-10-CM | POA: Diagnosis not present

## 2016-09-22 DIAGNOSIS — H43393 Other vitreous opacities, bilateral: Secondary | ICD-10-CM | POA: Diagnosis not present

## 2016-09-22 DIAGNOSIS — E119 Type 2 diabetes mellitus without complications: Secondary | ICD-10-CM | POA: Diagnosis not present

## 2016-09-22 DIAGNOSIS — Z961 Presence of intraocular lens: Secondary | ICD-10-CM | POA: Diagnosis not present

## 2016-09-22 DIAGNOSIS — H5711 Ocular pain, right eye: Secondary | ICD-10-CM | POA: Diagnosis not present

## 2016-10-17 DIAGNOSIS — M797 Fibromyalgia: Secondary | ICD-10-CM | POA: Diagnosis not present

## 2016-10-17 DIAGNOSIS — G478 Other sleep disorders: Secondary | ICD-10-CM | POA: Diagnosis not present

## 2016-10-17 DIAGNOSIS — E11319 Type 2 diabetes mellitus with unspecified diabetic retinopathy without macular edema: Secondary | ICD-10-CM | POA: Diagnosis not present

## 2016-12-05 DIAGNOSIS — R300 Dysuria: Secondary | ICD-10-CM | POA: Diagnosis not present

## 2017-01-16 DIAGNOSIS — Z23 Encounter for immunization: Secondary | ICD-10-CM | POA: Diagnosis not present

## 2017-01-20 DIAGNOSIS — H401113 Primary open-angle glaucoma, right eye, severe stage: Secondary | ICD-10-CM | POA: Diagnosis not present

## 2017-01-20 DIAGNOSIS — Z961 Presence of intraocular lens: Secondary | ICD-10-CM | POA: Diagnosis not present

## 2017-01-20 DIAGNOSIS — H02831 Dermatochalasis of right upper eyelid: Secondary | ICD-10-CM | POA: Diagnosis not present

## 2017-01-20 DIAGNOSIS — H401123 Primary open-angle glaucoma, left eye, severe stage: Secondary | ICD-10-CM | POA: Diagnosis not present

## 2017-01-20 DIAGNOSIS — H02834 Dermatochalasis of left upper eyelid: Secondary | ICD-10-CM | POA: Diagnosis not present

## 2017-04-19 ENCOUNTER — Other Ambulatory Visit: Payer: Self-pay | Admitting: Family Medicine

## 2017-04-19 DIAGNOSIS — Z139 Encounter for screening, unspecified: Secondary | ICD-10-CM

## 2017-05-26 ENCOUNTER — Ambulatory Visit
Admission: RE | Admit: 2017-05-26 | Discharge: 2017-05-26 | Disposition: A | Discharge status: Home / Self Care | Payer: Medicare Other | Source: Ambulatory Visit | Attending: Family Medicine | Admitting: Family Medicine

## 2017-05-26 DIAGNOSIS — Z139 Encounter for screening, unspecified: Secondary | ICD-10-CM

## 2017-05-26 DIAGNOSIS — Z1231 Encounter for screening mammogram for malignant neoplasm of breast: Secondary | ICD-10-CM | POA: Diagnosis not present

## 2017-07-03 ENCOUNTER — Encounter (INDEPENDENT_AMBULATORY_CARE_PROVIDER_SITE_OTHER): Payer: Medicare Other | Admitting: Ophthalmology

## 2017-07-03 DIAGNOSIS — I10 Essential (primary) hypertension: Secondary | ICD-10-CM

## 2017-07-03 DIAGNOSIS — H353132 Nonexudative age-related macular degeneration, bilateral, intermediate dry stage: Secondary | ICD-10-CM

## 2017-07-03 DIAGNOSIS — E11319 Type 2 diabetes mellitus with unspecified diabetic retinopathy without macular edema: Secondary | ICD-10-CM | POA: Diagnosis not present

## 2017-07-03 DIAGNOSIS — E113291 Type 2 diabetes mellitus with mild nonproliferative diabetic retinopathy without macular edema, right eye: Secondary | ICD-10-CM

## 2017-07-03 DIAGNOSIS — H35033 Hypertensive retinopathy, bilateral: Secondary | ICD-10-CM

## 2017-07-03 DIAGNOSIS — H43813 Vitreous degeneration, bilateral: Secondary | ICD-10-CM

## 2017-07-10 ENCOUNTER — Ambulatory Visit (INDEPENDENT_AMBULATORY_CARE_PROVIDER_SITE_OTHER): Payer: Self-pay | Admitting: Ophthalmology

## 2017-07-11 ENCOUNTER — Ambulatory Visit (INDEPENDENT_AMBULATORY_CARE_PROVIDER_SITE_OTHER): Payer: Self-pay | Admitting: Ophthalmology

## 2017-07-20 DIAGNOSIS — E119 Type 2 diabetes mellitus without complications: Secondary | ICD-10-CM | POA: Diagnosis not present

## 2017-07-20 DIAGNOSIS — H401132 Primary open-angle glaucoma, bilateral, moderate stage: Secondary | ICD-10-CM | POA: Diagnosis not present

## 2017-07-20 DIAGNOSIS — Z961 Presence of intraocular lens: Secondary | ICD-10-CM | POA: Diagnosis not present

## 2017-07-20 DIAGNOSIS — H353131 Nonexudative age-related macular degeneration, bilateral, early dry stage: Secondary | ICD-10-CM | POA: Diagnosis not present

## 2017-08-16 DIAGNOSIS — E119 Type 2 diabetes mellitus without complications: Secondary | ICD-10-CM | POA: Diagnosis not present

## 2017-08-16 DIAGNOSIS — Z7984 Long term (current) use of oral hypoglycemic drugs: Secondary | ICD-10-CM | POA: Diagnosis not present

## 2018-01-11 DIAGNOSIS — Z23 Encounter for immunization: Secondary | ICD-10-CM | POA: Diagnosis not present

## 2018-01-25 DIAGNOSIS — Z961 Presence of intraocular lens: Secondary | ICD-10-CM | POA: Diagnosis not present

## 2018-01-25 DIAGNOSIS — E119 Type 2 diabetes mellitus without complications: Secondary | ICD-10-CM | POA: Diagnosis not present

## 2018-01-25 DIAGNOSIS — H401132 Primary open-angle glaucoma, bilateral, moderate stage: Secondary | ICD-10-CM | POA: Diagnosis not present

## 2018-01-25 DIAGNOSIS — H353131 Nonexudative age-related macular degeneration, bilateral, early dry stage: Secondary | ICD-10-CM | POA: Diagnosis not present

## 2018-01-25 DIAGNOSIS — H401131 Primary open-angle glaucoma, bilateral, mild stage: Secondary | ICD-10-CM | POA: Diagnosis not present

## 2018-02-07 DIAGNOSIS — Z7984 Long term (current) use of oral hypoglycemic drugs: Secondary | ICD-10-CM | POA: Diagnosis not present

## 2018-02-07 DIAGNOSIS — E119 Type 2 diabetes mellitus without complications: Secondary | ICD-10-CM | POA: Diagnosis not present

## 2018-02-07 DIAGNOSIS — I10 Essential (primary) hypertension: Secondary | ICD-10-CM | POA: Diagnosis not present

## 2018-05-14 ENCOUNTER — Other Ambulatory Visit: Payer: Self-pay | Admitting: Family Medicine

## 2018-05-14 DIAGNOSIS — Z1231 Encounter for screening mammogram for malignant neoplasm of breast: Secondary | ICD-10-CM

## 2018-06-05 ENCOUNTER — Other Ambulatory Visit: Payer: Self-pay

## 2018-06-05 ENCOUNTER — Ambulatory Visit
Admission: RE | Admit: 2018-06-05 | Discharge: 2018-06-05 | Disposition: A | Discharge status: Home / Self Care | Payer: Medicare Other | Source: Ambulatory Visit | Attending: Family Medicine | Admitting: Family Medicine

## 2018-06-05 DIAGNOSIS — Z1231 Encounter for screening mammogram for malignant neoplasm of breast: Secondary | ICD-10-CM

## 2018-07-16 ENCOUNTER — Encounter (INDEPENDENT_AMBULATORY_CARE_PROVIDER_SITE_OTHER): Payer: Medicare Other | Admitting: Ophthalmology

## 2018-08-06 DIAGNOSIS — H401131 Primary open-angle glaucoma, bilateral, mild stage: Secondary | ICD-10-CM | POA: Diagnosis not present

## 2018-08-06 DIAGNOSIS — H401132 Primary open-angle glaucoma, bilateral, moderate stage: Secondary | ICD-10-CM | POA: Diagnosis not present

## 2018-08-28 DIAGNOSIS — Z7984 Long term (current) use of oral hypoglycemic drugs: Secondary | ICD-10-CM | POA: Diagnosis not present

## 2018-08-28 DIAGNOSIS — G629 Polyneuropathy, unspecified: Secondary | ICD-10-CM | POA: Diagnosis not present

## 2018-08-28 DIAGNOSIS — I10 Essential (primary) hypertension: Secondary | ICD-10-CM | POA: Diagnosis not present

## 2018-08-28 DIAGNOSIS — E11319 Type 2 diabetes mellitus with unspecified diabetic retinopathy without macular edema: Secondary | ICD-10-CM | POA: Diagnosis not present

## 2018-09-06 ENCOUNTER — Other Ambulatory Visit: Payer: Self-pay

## 2018-09-06 ENCOUNTER — Encounter (INDEPENDENT_AMBULATORY_CARE_PROVIDER_SITE_OTHER): Payer: Medicare Other | Admitting: Ophthalmology

## 2018-09-06 DIAGNOSIS — I10 Essential (primary) hypertension: Secondary | ICD-10-CM

## 2018-09-06 DIAGNOSIS — E113293 Type 2 diabetes mellitus with mild nonproliferative diabetic retinopathy without macular edema, bilateral: Secondary | ICD-10-CM | POA: Diagnosis not present

## 2018-09-06 DIAGNOSIS — H353132 Nonexudative age-related macular degeneration, bilateral, intermediate dry stage: Secondary | ICD-10-CM | POA: Diagnosis not present

## 2018-09-06 DIAGNOSIS — E11319 Type 2 diabetes mellitus with unspecified diabetic retinopathy without macular edema: Secondary | ICD-10-CM

## 2018-09-06 DIAGNOSIS — H43813 Vitreous degeneration, bilateral: Secondary | ICD-10-CM

## 2018-09-06 DIAGNOSIS — H35033 Hypertensive retinopathy, bilateral: Secondary | ICD-10-CM

## 2018-11-22 DIAGNOSIS — Z23 Encounter for immunization: Secondary | ICD-10-CM | POA: Diagnosis not present

## 2019-02-05 DIAGNOSIS — H53022 Refractive amblyopia, left eye: Secondary | ICD-10-CM | POA: Diagnosis not present

## 2019-02-05 DIAGNOSIS — H04123 Dry eye syndrome of bilateral lacrimal glands: Secondary | ICD-10-CM | POA: Diagnosis not present

## 2019-02-05 DIAGNOSIS — H353131 Nonexudative age-related macular degeneration, bilateral, early dry stage: Secondary | ICD-10-CM | POA: Diagnosis not present

## 2019-02-05 DIAGNOSIS — E119 Type 2 diabetes mellitus without complications: Secondary | ICD-10-CM | POA: Diagnosis not present

## 2019-02-05 DIAGNOSIS — H35371 Puckering of macula, right eye: Secondary | ICD-10-CM | POA: Diagnosis not present

## 2019-02-05 DIAGNOSIS — Z961 Presence of intraocular lens: Secondary | ICD-10-CM | POA: Diagnosis not present

## 2019-02-05 DIAGNOSIS — H401131 Primary open-angle glaucoma, bilateral, mild stage: Secondary | ICD-10-CM | POA: Diagnosis not present

## 2019-03-26 DIAGNOSIS — E11319 Type 2 diabetes mellitus with unspecified diabetic retinopathy without macular edema: Secondary | ICD-10-CM | POA: Diagnosis not present

## 2019-03-26 DIAGNOSIS — I10 Essential (primary) hypertension: Secondary | ICD-10-CM | POA: Diagnosis not present

## 2019-03-26 DIAGNOSIS — R310 Gross hematuria: Secondary | ICD-10-CM | POA: Diagnosis not present

## 2019-03-27 ENCOUNTER — Emergency Department (HOSPITAL_BASED_OUTPATIENT_CLINIC_OR_DEPARTMENT_OTHER)
Admission: EM | Admit: 2019-03-27 | Discharge: 2019-03-27 | Disposition: A | Discharge status: Home / Self Care | Payer: Medicare Other | Attending: Emergency Medicine | Admitting: Emergency Medicine

## 2019-03-27 ENCOUNTER — Encounter (HOSPITAL_BASED_OUTPATIENT_CLINIC_OR_DEPARTMENT_OTHER): Payer: Self-pay

## 2019-03-27 ENCOUNTER — Other Ambulatory Visit: Payer: Self-pay

## 2019-03-27 DIAGNOSIS — Z7984 Long term (current) use of oral hypoglycemic drugs: Secondary | ICD-10-CM | POA: Diagnosis not present

## 2019-03-27 DIAGNOSIS — E871 Hypo-osmolality and hyponatremia: Secondary | ICD-10-CM

## 2019-03-27 DIAGNOSIS — R531 Weakness: Secondary | ICD-10-CM | POA: Diagnosis not present

## 2019-03-27 DIAGNOSIS — E119 Type 2 diabetes mellitus without complications: Secondary | ICD-10-CM | POA: Insufficient documentation

## 2019-03-27 DIAGNOSIS — E860 Dehydration: Secondary | ICD-10-CM | POA: Diagnosis not present

## 2019-03-27 DIAGNOSIS — I10 Essential (primary) hypertension: Secondary | ICD-10-CM | POA: Insufficient documentation

## 2019-03-27 DIAGNOSIS — N3000 Acute cystitis without hematuria: Secondary | ICD-10-CM | POA: Diagnosis not present

## 2019-03-27 DIAGNOSIS — Z79899 Other long term (current) drug therapy: Secondary | ICD-10-CM | POA: Diagnosis not present

## 2019-03-27 LAB — URINALYSIS, ROUTINE W REFLEX MICROSCOPIC
Bilirubin Urine: NEGATIVE
Glucose, UA: NEGATIVE mg/dL
Ketones, ur: NEGATIVE mg/dL
Nitrite: POSITIVE — AB
Protein, ur: NEGATIVE mg/dL
Specific Gravity, Urine: 1.015 (ref 1.005–1.030)
pH: 6 (ref 5.0–8.0)

## 2019-03-27 LAB — CBC WITH DIFFERENTIAL/PLATELET
Abs Immature Granulocytes: 0.01 10*3/uL (ref 0.00–0.07)
Basophils Absolute: 0 10*3/uL (ref 0.0–0.1)
Basophils Relative: 0 %
Eosinophils Absolute: 0.1 10*3/uL (ref 0.0–0.5)
Eosinophils Relative: 1 %
HCT: 38.4 % (ref 36.0–46.0)
Hemoglobin: 12.6 g/dL (ref 12.0–15.0)
Immature Granulocytes: 0 %
Lymphocytes Relative: 21 %
Lymphs Abs: 1.2 10*3/uL (ref 0.7–4.0)
MCH: 32.5 pg (ref 26.0–34.0)
MCHC: 32.8 g/dL (ref 30.0–36.0)
MCV: 99 fL (ref 80.0–100.0)
Monocytes Absolute: 0.5 10*3/uL (ref 0.1–1.0)
Monocytes Relative: 8 %
Neutro Abs: 3.9 10*3/uL (ref 1.7–7.7)
Neutrophils Relative %: 70 %
Platelets: 331 10*3/uL (ref 150–400)
RBC: 3.88 MIL/uL (ref 3.87–5.11)
RDW: 12.6 % (ref 11.5–15.5)
WBC: 5.6 10*3/uL (ref 4.0–10.5)
nRBC: 0 % (ref 0.0–0.2)

## 2019-03-27 LAB — URINALYSIS, MICROSCOPIC (REFLEX): WBC, UA: >50 WBC/hpf (ref 0–5)

## 2019-03-27 LAB — COMPREHENSIVE METABOLIC PANEL
ALT: 25 U/L (ref 0–44)
AST: 25 U/L (ref 15–41)
Albumin: 4 g/dL (ref 3.5–5.0)
Alkaline Phosphatase: 58 U/L (ref 38–126)
Anion gap: 8 (ref 5–15)
BUN: 14 mg/dL (ref 8–23)
CO2: 27 mmol/L (ref 22–32)
Calcium: 9.7 mg/dL (ref 8.9–10.3)
Chloride: 93 mmol/L — ABNORMAL LOW (ref 98–111)
Creatinine, Ser: 0.47 mg/dL (ref 0.44–1.00)
GFR calc Af Amer: >60 mL/min (ref >60)
GFR calc non Af Amer: >60 mL/min (ref >60)
Glucose, Bld: 172 mg/dL — ABNORMAL HIGH (ref 70–99)
Potassium: 4.2 mmol/L (ref 3.5–5.1)
Sodium: 128 mmol/L — ABNORMAL LOW (ref 135–145)
Total Bilirubin: 0.3 mg/dL (ref 0.3–1.2)
Total Protein: 7.2 g/dL (ref 6.5–8.1)

## 2019-03-27 LAB — MAGNESIUM: Magnesium: 2.1 mg/dL (ref 1.7–2.4)

## 2019-03-27 LAB — LIPASE, BLOOD: Lipase: 40 U/L (ref 11–51)

## 2019-03-27 MED ORDER — CEPHALEXIN 500 MG PO CAPS: 500.0000 mg | ORAL_CAPSULE | Freq: Three times a day (TID) | ORAL | 0 refills | Status: DC

## 2019-03-27 MED ORDER — CEFTRIAXONE SODIUM 1 G IJ SOLR
1.0000 g | Freq: Once | INTRAMUSCULAR | Status: AC
  Administered 2019-03-27: 1 g via INTRAVENOUS
  Filled 2019-03-27: qty 10

## 2019-03-27 MED ORDER — SODIUM CHLORIDE 0.9 % IV BOLUS
1000.0000 mL | Freq: Once | INTRAVENOUS | Status: AC
  Administered 2019-03-27: 09:00:00 1000 mL via INTRAVENOUS

## 2019-03-27 MED ORDER — SODIUM CHLORIDE 0.9 % IV SOLN
INTRAVENOUS | Status: DC | PRN
  Administered 2019-03-27: 10:00:00 250 mL via INTRAVENOUS

## 2019-03-27 MED FILL — CEPHALEXIN 500 MG CAPSULE: 500 | 7 days supply | Qty: 21 | Fill #0

## 2019-03-27 NOTE — ED Triage Notes (Signed)
Pt states that she was sent from her PCP for abnormal sodium and potassium. Pt also reports some burning with urination.

## 2019-03-27 NOTE — Discharge Instructions (Signed)
Stay hydrated   Take keflex three times daily for UTI   Recheck your chemistry at your doctor's office in a week   See your doctor   Return to ER if you have worse weakness, vomiting, fever, abdominal pain.

## 2019-03-27 NOTE — ED Notes (Signed)
ED Provider at bedside. 

## 2019-03-27 NOTE — ED Provider Notes (Signed)
[facility] EMERGENCY DEPARTMENT Provider Note   CSN: [id number] Arrival date & time: 03/27/19  0749     History Chief Complaint  Patient presents with  . Abnormal Lab    [name] is a 86 y.o. female history diabetes, hypertension, here presenting with abnormal labs.  Patient states that she has not been eating or drinking much for the last several days.  She is feeling weak all over.  She had labs drawn yesterday apparently her chloride and potassium are both low so she was told to come in for evaluation.  She also had some dysuria and bladder fullness and apparently had a urinalysis done yesterday in the office with results pending .  Denies any fever chills or vomiting  The history is provided by the patient.       Past Medical History:  Diagnosis Date  . Cancer ([place]) 1980's   endometrial, hystectomy done  . Diabetes mellitus without complication ([place])   . Hypertension   . Peripheral neuropathy    feet    There are no problems to display for this patient.   Past Surgical History:  Procedure Laterality Date  . ABDOMINAL HYSTERECTOMY    . BACK SURGERY  4 years ago   lower back  . breast cyst removed Bilateral   . BREAST EXCISIONAL BIOPSY Right 1960  . BREAST EXCISIONAL BIOPSY Bilateral   . CHOLECYSTECTOMY    . COLONOSCOPY WITH PROPOFOL N/A 03/02/2015   Procedure: COLONOSCOPY WITH PROPOFOL;  Surgeon: Garlan Fair, MD;  Location: [facility] ENDOSCOPY;  Service: Endoscopy;  Laterality: N/A;  . cyst removed from kidney  1970     OB History   No obstetric history on file.     No family history on file.  Social History   Tobacco Use  . Smoking status: Never Smoker  . Smokeless tobacco: Never Used  Substance Use Topics  . Alcohol use: No  . Drug use: No    Home Medications Prior to Admission medications   Medication Sig Start Date End Date Taking? Authorizing Provider  acetaminophen (TYLENOL) 500 MG tablet Take 1,000 mg by mouth every 8  (eight) hours as needed for mild pain.    [provider]  cyclobenzaprine (FLEXERIL) 10 MG tablet Take 10 mg by mouth 2 (two) times daily.    [provider]  latanoprost (XALATAN) 0.005 % ophthalmic solution Place 1 drop into both eyes at bedtime.  11/05/14   [provider]  LYRICA 50 MG capsule Take 50 mg by mouth 2 (two) times daily.  12/02/14   [provider]  metFORMIN (GLUCOPHAGE) 500 MG tablet  02/10/19   [provider]  Multiple Vitamins-Minerals (PRESERVISION AREDS) CAPS Take 1 capsule by mouth 2 (two) times daily.    [provider]  quinapril (ACCUPRIL) 20 MG tablet Take 20 mg by mouth daily.  10/10/14   [provider]  rosuvastatin (CRESTOR) 5 MG tablet  02/02/19   [provider]  sodium chloride (OCEAN) 0.65 % SOLN nasal spray Place 1 spray into both nostrils every 8 (eight) hours as needed for congestion.    [provider]  traMADol (ULTRAM) 50 MG tablet Take 50 mg by mouth every 6 (six) hours as needed for moderate pain or severe pain.  12/02/14   [provider]    Allergies    Codeine  Review of Systems   Review of Systems  Gastrointestinal: Positive for nausea.  Genitourinary: Positive for dysuria.  All other systems reviewed and are negative.   Physical Exam Updated Vital Signs BP 118/65   Pulse 83   Temp 98.6 F (37 C) (Oral)   Resp 15   Ht 5\' 2"  (1.575 m)   Wt 49.9 kg   SpO2 99%   BMI 20.12 kg/m   Physical Exam Vitals and nursing note reviewed.  HENT:     Head: Normocephalic.     Nose: Nose normal.     Mouth/Throat:     Mouth: Mucous membranes are dry.  Eyes:     Extraocular Movements: Extraocular movements intact.     Pupils: Pupils are equal, round, and reactive to light.  Cardiovascular:     Rate and Rhythm: Normal rate and regular rhythm.     Pulses: Normal pulses.     Heart sounds: Normal heart sounds.  Pulmonary:     Effort: Pulmonary effort is  normal.     Breath sounds: Normal breath sounds.  Abdominal:     General: Abdomen is flat.     Palpations: Abdomen is soft.  Musculoskeletal:        General: Normal range of motion.     Cervical back: Normal range of motion.  Skin:    General: Skin is warm.     Capillary Refill: Capillary refill takes less than 2 seconds.  Neurological:     General: No focal deficit present.     Mental Status: She is alert and oriented to person, place, and time.  Psychiatric:        Mood and Affect: Mood normal.        Behavior: Behavior normal.     ED Results / Procedures / Treatments   Labs (all labs ordered are listed, but only abnormal results are displayed) Labs Reviewed  COMPREHENSIVE METABOLIC PANEL - Abnormal; Notable for the following components:      Result Value   Sodium 128 (*)    Chloride 93 (*)    Glucose, Bld 172 (*)    All other components within normal limits  URINALYSIS, ROUTINE W REFLEX MICROSCOPIC - Abnormal; Notable for the following components:   APPearance TURBID (*)    Hgb urine dipstick SMALL (*)    Nitrite POSITIVE (*)    Leukocytes,Ua LARGE (*)    All other components within normal limits  URINALYSIS, MICROSCOPIC (REFLEX) - Abnormal; Notable for the following components:   Bacteria, UA MANY (*)    All other components within normal limits  URINE CULTURE  CBC WITH DIFFERENTIAL/PLATELET  LIPASE, BLOOD  MAGNESIUM    EKG None  ED ECG REPORT I, Wandra Arthurs, the attending physician, personally viewed and interpreted this ECG.   Date: 03/27/2019  EKG Time: 8:23 am   Rate: 86  Rhythm: normal EKG, normal sinus rhythm  Axis: normal  Intervals:none  ST&T Change: none   Radiology No results found.  Procedures Procedures (including critical care time)    Medications Ordered in ED Medications  cefTRIAXone (ROCEPHIN) 1 g in sodium chloride 0.9 % 100 mL IVPB (1 g Intravenous New Bag/Given 03/27/19 1008)  0.9 %  sodium chloride infusion (250 mLs  Intravenous New Bag/Given 03/27/19 1007)  sodium chloride 0.9 % bolus 1,000 mL ( Intravenous Stopped 03/27/19 0954)    ED Course  I have reviewed the triage vital signs and the nursing notes.  Pertinent labs & imaging results that were available during my care of the patient were reviewed by me and considered in my medical decision making (  see chart for details).    MDM Rules/Calculators/A&P                      AMIRYKAL LARDIE is a 86 y.o. female here with weakness, dysuria. Patient apparently has low chloride and low potassium levels.  Suspect that this is secondary to dehydration.  We will recheck labs and hydrate patient. Patient also has dysuria so we will check a urinalysis as well.  10:22 AM Sodium is 128, baseline around 130. Chloride is 93. Potassium normal. UA + UTI. No CVAT and afebrile.  Given Rocephin for UTI .  Urine culture sent. I think the hyponatremia and hypochloremia is likely secondary to dehydration.  Patient was given normal saline bolus.  Told her to stay hydrated and take Keflex as prescribed and she can get repeat chemistry in a week outpatient.   Final Clinical Impression(s) / ED Diagnoses Final diagnoses:  None    Rx / DC Orders ED Discharge Orders    None       Drenda Freeze, MD 03/27/19 1023

## 2019-03-30 LAB — URINE CULTURE: Culture: >=100000 — AB

## 2019-03-31 ENCOUNTER — Telehealth: Payer: Self-pay | Admitting: Emergency Medicine

## 2019-03-31 NOTE — Telephone Encounter (Signed)
Post ED Visit - Positive Culture Follow-up  Culture report reviewed by antimicrobial stewardship pharmacist: [facility] Team []  Elenor Quinones, Pharm.D. []  Heide Guile, Pharm.D., BCPS AQ-ID []  Parks Neptune, Pharm.D., BCPS []  Alycia Rossetti, Pharm.D., BCPS []  [place], Pharm.D., BCPS, AAHIVP []  Legrand Como, Pharm.D., BCPS, AAHIVP []  Salome Arnt, PharmD, BCPS []  Johnnette Gourd, PharmD, BCPS [x]  Hughes Better, PharmD, BCPS []  Leeroy Cha, PharmD []  Laqueta Linden, PharmD, BCPS []  Albertina Parr, PharmD  [facility] Team []  Leodis Sias, PharmD []  Lindell Spar, PharmD []  Royetta Asal, PharmD []  Graylin Shiver, Rph []  Rema Fendt) Glennon Mac, PharmD []  Arlyn Dunning, PharmD []  Netta Cedars, PharmD []  Dia Sitter, PharmD []  Leone Haven, PharmD []  Gretta Arab, PharmD []  Theodis Shove, PharmD []  Peggyann Juba, PharmD []  Reuel Boom, PharmD   Positive urine culture Treated with cephalexin, organism sensitive to the same and no further patient follow-up is required at this time.  Hazle Nordmann 03/31/2019, 11:36 AM

## 2019-04-16 DIAGNOSIS — E871 Hypo-osmolality and hyponatremia: Secondary | ICD-10-CM | POA: Diagnosis not present

## 2019-04-16 DIAGNOSIS — N390 Urinary tract infection, site not specified: Secondary | ICD-10-CM | POA: Diagnosis not present

## 2019-04-17 DIAGNOSIS — E871 Hypo-osmolality and hyponatremia: Secondary | ICD-10-CM | POA: Diagnosis not present

## 2019-04-17 DIAGNOSIS — N390 Urinary tract infection, site not specified: Secondary | ICD-10-CM | POA: Diagnosis not present

## 2019-05-14 ENCOUNTER — Other Ambulatory Visit: Payer: Self-pay | Admitting: Family Medicine

## 2019-05-14 DIAGNOSIS — Z1231 Encounter for screening mammogram for malignant neoplasm of breast: Secondary | ICD-10-CM

## 2019-05-20 DIAGNOSIS — Z23 Encounter for immunization: Secondary | ICD-10-CM | POA: Diagnosis not present

## 2019-06-06 ENCOUNTER — Other Ambulatory Visit: Payer: Self-pay

## 2019-06-06 ENCOUNTER — Ambulatory Visit
Admission: RE | Admit: 2019-06-06 | Discharge: 2019-06-06 | Disposition: A | Discharge status: Home / Self Care | Payer: Medicare Other | Source: Ambulatory Visit | Attending: Family Medicine | Admitting: Family Medicine

## 2019-06-06 DIAGNOSIS — Z1231 Encounter for screening mammogram for malignant neoplasm of breast: Secondary | ICD-10-CM | POA: Diagnosis not present

## 2019-06-17 DIAGNOSIS — Z23 Encounter for immunization: Secondary | ICD-10-CM | POA: Diagnosis not present

## 2019-08-06 DIAGNOSIS — Z961 Presence of intraocular lens: Secondary | ICD-10-CM | POA: Diagnosis not present

## 2019-08-06 DIAGNOSIS — H04123 Dry eye syndrome of bilateral lacrimal glands: Secondary | ICD-10-CM | POA: Diagnosis not present

## 2019-08-06 DIAGNOSIS — H401131 Primary open-angle glaucoma, bilateral, mild stage: Secondary | ICD-10-CM | POA: Diagnosis not present

## 2019-08-06 DIAGNOSIS — H1045 Other chronic allergic conjunctivitis: Secondary | ICD-10-CM | POA: Diagnosis not present

## 2019-09-10 ENCOUNTER — Encounter (INDEPENDENT_AMBULATORY_CARE_PROVIDER_SITE_OTHER): Payer: Medicare Other | Admitting: Ophthalmology

## 2019-11-11 DIAGNOSIS — I10 Essential (primary) hypertension: Secondary | ICD-10-CM | POA: Diagnosis not present

## 2019-11-11 DIAGNOSIS — E11319 Type 2 diabetes mellitus with unspecified diabetic retinopathy without macular edema: Secondary | ICD-10-CM | POA: Diagnosis not present

## 2019-11-11 DIAGNOSIS — H9190 Unspecified hearing loss, unspecified ear: Secondary | ICD-10-CM | POA: Diagnosis not present

## 2019-11-11 DIAGNOSIS — Z974 Presence of external hearing-aid: Secondary | ICD-10-CM | POA: Diagnosis not present

## 2019-12-16 DIAGNOSIS — H903 Sensorineural hearing loss, bilateral: Secondary | ICD-10-CM | POA: Diagnosis not present

## 2020-01-02 DIAGNOSIS — Z23 Encounter for immunization: Secondary | ICD-10-CM | POA: Diagnosis not present

## 2020-01-08 ENCOUNTER — Encounter (HOSPITAL_COMMUNITY): Payer: Self-pay

## 2020-01-08 ENCOUNTER — Emergency Department (HOSPITAL_COMMUNITY): Payer: Medicare Other

## 2020-01-08 ENCOUNTER — Inpatient Hospital Stay (HOSPITAL_COMMUNITY)
Admission: EM | Admit: 2020-01-08 | Discharge: 2020-01-10 | DRG: 689 | Disposition: A | Discharge status: Home / Self Care | Payer: Medicare Other | Attending: Internal Medicine | Admitting: Internal Medicine

## 2020-01-08 ENCOUNTER — Other Ambulatory Visit: Payer: Self-pay

## 2020-01-08 DIAGNOSIS — R5381 Other malaise: Secondary | ICD-10-CM | POA: Diagnosis present

## 2020-01-08 DIAGNOSIS — E861 Hypovolemia: Secondary | ICD-10-CM | POA: Diagnosis present

## 2020-01-08 DIAGNOSIS — E871 Hypo-osmolality and hyponatremia: Secondary | ICD-10-CM | POA: Diagnosis not present

## 2020-01-08 DIAGNOSIS — G9341 Metabolic encephalopathy: Secondary | ICD-10-CM | POA: Diagnosis present

## 2020-01-08 DIAGNOSIS — R410 Disorientation, unspecified: Secondary | ICD-10-CM | POA: Diagnosis not present

## 2020-01-08 DIAGNOSIS — Z8542 Personal history of malignant neoplasm of other parts of uterus: Secondary | ICD-10-CM

## 2020-01-08 DIAGNOSIS — Z9049 Acquired absence of other specified parts of digestive tract: Secondary | ICD-10-CM

## 2020-01-08 DIAGNOSIS — Z79899 Other long term (current) drug therapy: Secondary | ICD-10-CM

## 2020-01-08 DIAGNOSIS — E119 Type 2 diabetes mellitus without complications: Secondary | ICD-10-CM | POA: Diagnosis not present

## 2020-01-08 DIAGNOSIS — N3289 Other specified disorders of bladder: Secondary | ICD-10-CM | POA: Diagnosis present

## 2020-01-08 DIAGNOSIS — I10 Essential (primary) hypertension: Secondary | ICD-10-CM | POA: Diagnosis not present

## 2020-01-08 DIAGNOSIS — R4182 Altered mental status, unspecified: Secondary | ICD-10-CM

## 2020-01-08 DIAGNOSIS — N390 Urinary tract infection, site not specified: Secondary | ICD-10-CM | POA: Diagnosis not present

## 2020-01-08 DIAGNOSIS — E222 Syndrome of inappropriate secretion of antidiuretic hormone: Secondary | ICD-10-CM | POA: Diagnosis present

## 2020-01-08 DIAGNOSIS — Z7984 Long term (current) use of oral hypoglycemic drugs: Secondary | ICD-10-CM

## 2020-01-08 DIAGNOSIS — Z20822 Contact with and (suspected) exposure to covid-19: Secondary | ICD-10-CM | POA: Diagnosis present

## 2020-01-08 DIAGNOSIS — R109 Unspecified abdominal pain: Secondary | ICD-10-CM | POA: Diagnosis not present

## 2020-01-08 DIAGNOSIS — N136 Pyonephrosis: Secondary | ICD-10-CM | POA: Diagnosis not present

## 2020-01-08 DIAGNOSIS — R300 Dysuria: Secondary | ICD-10-CM | POA: Diagnosis not present

## 2020-01-08 DIAGNOSIS — B964 Proteus (mirabilis) (morganii) as the cause of diseases classified elsewhere: Secondary | ICD-10-CM | POA: Diagnosis present

## 2020-01-08 DIAGNOSIS — Z90710 Acquired absence of both cervix and uterus: Secondary | ICD-10-CM

## 2020-01-08 LAB — URINALYSIS, ROUTINE W REFLEX MICROSCOPIC
Bilirubin Urine: NEGATIVE
Glucose, UA: NEGATIVE mg/dL
Ketones, ur: NEGATIVE mg/dL
Nitrite: NEGATIVE
Protein, ur: 100 mg/dL — AB
Specific Gravity, Urine: 1.013 (ref 1.005–1.030)
WBC, UA: >50 WBC/hpf — ABNORMAL HIGH (ref 0–5)
pH: 6 (ref 5.0–8.0)

## 2020-01-08 LAB — RESP PANEL BY RT PCR (RSV, FLU A&B, COVID)
Influenza A by PCR: NEGATIVE
Influenza B by PCR: NEGATIVE
Respiratory Syncytial Virus by PCR: NEGATIVE
SARS Coronavirus 2 by RT PCR: NEGATIVE

## 2020-01-08 LAB — CBC WITH DIFFERENTIAL/PLATELET
Abs Immature Granulocytes: 0.01 10*3/uL (ref 0.00–0.07)
Basophils Absolute: 0 10*3/uL (ref 0.0–0.1)
Basophils Relative: 0 %
Eosinophils Absolute: 0.1 10*3/uL (ref 0.0–0.5)
Eosinophils Relative: 1 %
HCT: 33.2 % — ABNORMAL LOW (ref 36.0–46.0)
Hemoglobin: 11 g/dL — ABNORMAL LOW (ref 12.0–15.0)
Immature Granulocytes: 0 %
Lymphocytes Relative: 18 %
Lymphs Abs: 1 10*3/uL (ref 0.7–4.0)
MCH: 32.9 pg (ref 26.0–34.0)
MCHC: 33.1 g/dL (ref 30.0–36.0)
MCV: 99.4 fL (ref 80.0–100.0)
Monocytes Absolute: 0.5 10*3/uL (ref 0.1–1.0)
Monocytes Relative: 8 %
Neutro Abs: 4.3 10*3/uL (ref 1.7–7.7)
Neutrophils Relative %: 73 %
Platelets: 266 10*3/uL (ref 150–400)
RBC: 3.34 MIL/uL — ABNORMAL LOW (ref 3.87–5.11)
RDW: 12.6 % (ref 11.5–15.5)
WBC: 5.8 10*3/uL (ref 4.0–10.5)
nRBC: 0 % (ref 0.0–0.2)

## 2020-01-08 LAB — SODIUM, URINE, RANDOM: Sodium, Ur: 63 mmol/L

## 2020-01-08 LAB — URIC ACID: Uric Acid, Serum: 3 mg/dL (ref 2.5–7.1)

## 2020-01-08 LAB — TSH: TSH: 1.563 u[IU]/mL (ref 0.350–4.500)

## 2020-01-08 LAB — OSMOLALITY: Osmolality: 265 mOsm/kg — ABNORMAL LOW (ref 275–295)

## 2020-01-08 LAB — BASIC METABOLIC PANEL
Anion gap: 7 (ref 5–15)
BUN: 19 mg/dL (ref 8–23)
CO2: 24 mmol/L (ref 22–32)
Calcium: 8.6 mg/dL — ABNORMAL LOW (ref 8.9–10.3)
Chloride: 92 mmol/L — ABNORMAL LOW (ref 98–111)
Creatinine, Ser: 0.6 mg/dL (ref 0.44–1.00)
GFR, Estimated: >60 mL/min (ref >60)
Glucose, Bld: 124 mg/dL — ABNORMAL HIGH (ref 70–99)
Potassium: 4.6 mmol/L (ref 3.5–5.1)
Sodium: 123 mmol/L — ABNORMAL LOW (ref 135–145)

## 2020-01-08 LAB — CORTISOL: Cortisol, Plasma: 9 ug/dL

## 2020-01-08 LAB — CBG MONITORING, ED: Glucose-Capillary: 100 mg/dL — ABNORMAL HIGH (ref 70–99)

## 2020-01-08 LAB — OSMOLALITY, URINE: Osmolality, Ur: 372 mOsm/kg (ref 300–900)

## 2020-01-08 MED ORDER — FENTANYL CITRATE (PF) 100 MCG/2ML IJ SOLN
25.0000 ug | Freq: Once | INTRAMUSCULAR | Status: AC
  Administered 2020-01-08: 25 ug via INTRAVENOUS
  Filled 2020-01-08: qty 2

## 2020-01-08 MED ORDER — SODIUM CHLORIDE (PF) 0.9 % IJ SOLN
INTRAMUSCULAR | Status: AC
  Filled 2020-01-08: qty 50

## 2020-01-08 MED ORDER — SODIUM CHLORIDE 0.9 % IV SOLN
1.0000 g | Freq: Once | INTRAVENOUS | Status: AC
  Administered 2020-01-08: 1 g via INTRAVENOUS
  Filled 2020-01-08: qty 10

## 2020-01-08 MED ORDER — LATANOPROST 0.005 % OP SOLN
1.0000 [drp] | Freq: Every day | OPHTHALMIC | Status: DC
  Administered 2020-01-08 – 2020-01-09 (×2): 1 [drp] via OPHTHALMIC
  Filled 2020-01-08 (×2): qty 2.5

## 2020-01-08 MED ORDER — INSULIN ASPART 100 UNIT/ML ~~LOC~~ SOLN
0.0000 [IU] | Freq: Three times a day (TID) | SUBCUTANEOUS | Status: DC
  Administered 2020-01-09: 9 [IU] via SUBCUTANEOUS
  Administered 2020-01-10: 2 [IU] via SUBCUTANEOUS
  Administered 2020-01-10: 1 [IU] via SUBCUTANEOUS
  Filled 2020-01-08: qty 0.09

## 2020-01-08 MED ORDER — INSULIN ASPART 100 UNIT/ML ~~LOC~~ SOLN
0.0000 [IU] | Freq: Every day | SUBCUTANEOUS | Status: DC
  Filled 2020-01-08: qty 0.05

## 2020-01-08 MED ORDER — LISINOPRIL 5 MG PO TABS
5.0000 mg | ORAL_TABLET | Freq: Every day | ORAL | Status: DC
  Administered 2020-01-09 – 2020-01-10 (×2): 5 mg via ORAL
  Filled 2020-01-08 (×2): qty 1

## 2020-01-08 MED ORDER — IOHEXOL 9 MG/ML PO SOLN
ORAL | Status: AC
  Administered 2020-01-08: 500 mL via ORAL
  Filled 2020-01-08: qty 500

## 2020-01-08 MED ORDER — SODIUM CHLORIDE 0.9 % IV SOLN: INTRAVENOUS | Status: DC

## 2020-01-08 MED ORDER — SODIUM CHLORIDE 0.9 % IV BOLUS
500.0000 mL | Freq: Once | INTRAVENOUS | Status: AC
  Administered 2020-01-08: 500 mL via INTRAVENOUS

## 2020-01-08 MED ORDER — IOHEXOL 9 MG/ML PO SOLN: 500.0000 mL | ORAL | Status: AC

## 2020-01-08 MED ORDER — SODIUM CHLORIDE 0.9 % IV SOLN
1.0000 g | INTRAVENOUS | Status: DC
  Administered 2020-01-09: 1 g via INTRAVENOUS
  Filled 2020-01-08 (×2): qty 10
  Filled 2020-01-08: qty 1

## 2020-01-08 MED ORDER — IOHEXOL 300 MG/ML  SOLN
100.0000 mL | Freq: Once | INTRAMUSCULAR | Status: AC | PRN
  Administered 2020-01-08: 100 mL via INTRAVENOUS

## 2020-01-08 MED ORDER — PHENAZOPYRIDINE HCL 100 MG PO TABS
100.0000 mg | ORAL_TABLET | Freq: Three times a day (TID) | ORAL | Status: DC
  Administered 2020-01-09 – 2020-01-10 (×5): 100 mg via ORAL
  Filled 2020-01-08 (×6): qty 1

## 2020-01-08 NOTE — ED Triage Notes (Addendum)
Pt bib ems from home w/cc of painful urination that began about a week ago. Pt sts she "can't stand the pain anymore." Per EMS, pt confused and unable to follow directions and "you could smell the UTI when you walk into her house."  Pt lives alone but told EMS her niece checks on her a few times / week. Pt a/oX4 in triage.  Hx dm  138/74 HR 110 98.1f RR 16 CBG 132

## 2020-01-08 NOTE — ED Notes (Signed)
[name]: niece would like updates [phone]

## 2020-01-08 NOTE — ED Provider Notes (Signed)
[facility] DEPT Provider Note   CSN: [id number] Arrival date & time: 01/08/20  6270     History Chief Complaint  Patient presents with   Dysuria    [name] is a 86 y.o. female.  HPI Brought in reportedly for painful urination.  Reported event lasting for around the last week.  Lives alone.  States the pain is getting so severe.  Does go up to the flank.  Per EMS had been confused and could not follow directions.  Patient states the pain is in her lower abdomen.  Denies fevers.  Denies vomiting.  States she has kidney problems because of her diabetes.    Past Medical History:  Diagnosis Date   Cancer ([place]) 1980's   endometrial, hystectomy done   Diabetes mellitus without complication ([place])    Hypertension    Peripheral neuropathy    feet    There are no problems to display for this patient.   Past Surgical History:  Procedure Laterality Date   ABDOMINAL HYSTERECTOMY     BACK SURGERY  4 years ago   lower back   breast cyst removed Bilateral    BREAST EXCISIONAL BIOPSY Right 1960   BREAST EXCISIONAL BIOPSY Bilateral    CHOLECYSTECTOMY     COLONOSCOPY WITH PROPOFOL N/A 03/02/2015   Procedure: COLONOSCOPY WITH PROPOFOL;  Surgeon: Garlan Fair, MD;  Location: [facility];  Service: Endoscopy;  Laterality: N/A;   cyst removed from kidney  1970     OB History   No obstetric history on file.     No family history on file.  Social History   Tobacco Use   Smoking status: Never Smoker   Smokeless tobacco: Never Used  Vaping Use   Vaping Use: Never used  Substance Use Topics   Alcohol use: No   Drug use: No    Home Medications Prior to Admission medications   Medication Sig Start Date End Date Taking? Authorizing Provider  acetaminophen (TYLENOL) 500 MG tablet Take 1,000 mg by mouth every 8 (eight) hours as needed for mild pain.   Yes [provider]  latanoprost (XALATAN) 0.005 %  ophthalmic solution Place 1 drop into both eyes at bedtime.  11/05/14  Yes [provider]  metFORMIN (GLUCOPHAGE) 500 MG tablet  02/10/19  Yes [provider]  Multiple Vitamins-Minerals (PRESERVISION AREDS 2+MULTI VIT PO) Take 1 tablet by mouth in the morning and at bedtime.   Yes [provider]  quinapril (ACCUPRIL) 5 MG tablet Take 5 mg by mouth daily. 12/09/19  Yes [provider]    Allergies    Codeine  Review of Systems   Review of Systems  Constitutional: Negative for fever.  HENT: Negative for congestion.   Respiratory: Negative for shortness of breath.   Cardiovascular: Negative for chest pain.  Gastrointestinal: Positive for abdominal pain.  Genitourinary: Positive for dysuria.  Musculoskeletal: Positive for back pain.  Skin: Negative for rash.  Neurological: Negative for weakness.  Psychiatric/Behavioral: Positive for confusion.    Physical Exam Updated Vital Signs BP 111/87    Pulse 74    Temp 97.9 F (36.6 C) (Oral)    Resp (!) 27    Ht 5\' 3"  (1.6 m)    Wt 49 kg    SpO2 100%    BMI 19.13 kg/m   Physical Exam Vitals and nursing note reviewed.  HENT:     Head: Normocephalic.  Eyes:     Pupils: Pupils are  equal, round, and reactive to light.  Cardiovascular:     Rate and Rhythm: Regular rhythm.  Abdominal:     Tenderness: There is abdominal tenderness.     Comments: Lower abdominal tenderness.  Some potential fullness.  No rebound or guarding.  No hernia palpated.  Musculoskeletal:        General: No tenderness.  Skin:    General: Skin is warm.     Capillary Refill: Capillary refill takes less than 2 seconds.  Neurological:     Mental Status: She is alert.     Comments: Awake and does answer questions appropriately for me.     ED Results / Procedures / Treatments   Labs (all labs ordered are listed, but only abnormal results are displayed) Labs Reviewed  URINALYSIS, ROUTINE W REFLEX MICROSCOPIC - Abnormal; Notable  for the following components:      Result Value   APPearance TURBID (*)    Hgb urine dipstick SMALL (*)    Protein, ur 100 (*)    Leukocytes,Ua LARGE (*)    WBC, UA >50 (*)    Bacteria, UA RARE (*)    All other components within normal limits  CBC WITH DIFFERENTIAL/PLATELET - Abnormal; Notable for the following components:   RBC 3.34 (*)    Hemoglobin 11.0 (*)    HCT 33.2 (*)    All other components within normal limits  BASIC METABOLIC PANEL - Abnormal; Notable for the following components:   Sodium 123 (*)    Chloride 92 (*)    Glucose, Bld 124 (*)    Calcium 8.6 (*)    All other components within normal limits  URINE CULTURE    EKG None  Radiology No results found.  Procedures Procedures (including critical care time)  Medications Ordered in ED Medications  iohexol (OMNIPAQUE) 9 MG/ML oral solution 500 mL (500 mLs Oral Contrast Given 01/08/20 1357)  fentaNYL (SUBLIMAZE) injection 25 mcg (has no administration in time range)  sodium chloride 0.9 % bolus 500 mL (has no administration in time range)    ED Course  I have reviewed the triage vital signs and the nursing notes.  Pertinent labs & imaging results that were available during my care of the patient were reviewed by me and considered in my medical decision making (see chart for details).    MDM Rules/Calculators/A&P                          Patient brought in for dysuria abdominal pain and confusion.  Lab work reassuring.  Does have hyponatremia and potentially UTI however.  With continued tenderness will get CT scan.  Care turned over to Dr. Vanita Panda. Testing reviewed by myself.   Final Clinical Impression(s) / ED Diagnoses Final diagnoses:  Hyponatremia    Rx / DC Orders ED Discharge Orders    None       Davonna Belling, MD 01/08/20 1513

## 2020-01-08 NOTE — H&P (Addendum)
History and Physical    [name] RKY:[id number] DOB: Nov 02, 1933 DOA: 01/08/2020  PCP: Cloyd Stagers, MD   Patient coming from: Home    Chief Complaint: Confusion, dysuria  HPI: [name] is a 86 y.o. female with medical history significant of hypertension, diabetes who was brought to the emergency department by EMS for the evaluation of painful urination, confusion.  As per the information provided she had this problem started about 1 week ago.  Patient is a very poor historian and was confused when EMS found her.  She called 911 because she was having severe lower abdominal pain and frequent urination.  Patient lives alone . Patient seen and examined at the bedside in the emergency department.  She was hemodynamically stable during my evaluation.  She was already feeling much better and she was alert and oriented.  Niece at the bedside .She denies any chest pain, shortness of breath, cough,nausea, vomiting, diarrhea, hematochezia.  She was observed to have frequent urination in the next department.  She was complaining of severe lower abdominal pain.  She denies any history of UTI in the past.  She has a primary care physician.  She does not have any problem with ambulation at home.  ED Course: On presentation, she was hemodynamically stable.  Lab work showed sodium of 123.  UA was suggestive of UTI.  CT abdomen/pelvis with contrast showed mild left hydronephrosis  without obstructing calculus, some degree of ureteropelvic junction stenosis,mild urinary bladder distention .  Patient was admitted for the management of UTI and hyponatremia.  Review of Systems: As per HPI otherwise 10 point review of systems negative.    Past Medical History:  Diagnosis Date  . Cancer ([place]) 1980's   endometrial, hystectomy done  . Diabetes mellitus without complication ([place])   . Hypertension   . Peripheral neuropathy    feet    Past Surgical History:  Procedure Laterality Date   . ABDOMINAL HYSTERECTOMY    . BACK SURGERY  4 years ago   lower back  . breast cyst removed Bilateral   . BREAST EXCISIONAL BIOPSY Right 1960  . BREAST EXCISIONAL BIOPSY Bilateral   . CHOLECYSTECTOMY    . COLONOSCOPY WITH PROPOFOL N/A 03/02/2015   Procedure: COLONOSCOPY WITH PROPOFOL;  Surgeon: Garlan Fair, MD;  Location: [facility];  Service: Endoscopy;  Laterality: N/A;  . cyst removed from kidney  1970     reports that she has never smoked. She has never used smokeless tobacco. She reports that she does not drink alcohol and does not use drugs.  Allergies  Allergen Reactions  . Codeine     dizziness    No family history on file.   Prior to Admission medications   Medication Sig Start Date End Date Taking? Authorizing Provider  acetaminophen (TYLENOL) 500 MG tablet Take 1,000 mg by mouth every 8 (eight) hours as needed for mild pain.   Yes [provider]  latanoprost (XALATAN) 0.005 % ophthalmic solution Place 1 drop into both eyes at bedtime.  11/05/14  Yes [provider]  metFORMIN (GLUCOPHAGE) 500 MG tablet  02/10/19  Yes [provider]  Multiple Vitamins-Minerals (PRESERVISION AREDS 2+MULTI VIT PO) Take 1 tablet by mouth in the morning and at bedtime.   Yes [provider]  quinapril (ACCUPRIL) 5 MG tablet Take 5 mg by mouth daily. 12/09/19  Yes [provider]    Physical Exam: Vitals:   01/08/20 1142 01/08/20 1239 01/08/20 1347 01/08/20  1430  BP: 126/75 129/74 123/66 111/87  Pulse: 82 81 84 74  Resp: 14 12 14  (!) 27  Temp:      TempSrc:      SpO2: 96% 98% 98% 100%  Weight:      Height:        Constitutional: Comfortable, pleasant elderly female Vitals:   01/08/20 1142 01/08/20 1239 01/08/20 1347 01/08/20 1430  BP: 126/75 129/74 123/66 111/87  Pulse: 82 81 84 74  Resp: 14 12 14  (!) 27  Temp:      TempSrc:      SpO2: 96% 98% 98% 100%  Weight:      Height:       Eyes: PERRL, lids and conjunctivae  normal ENMT: Mucous membranes are moist.  Neck: normal, supple, no masses, no thyromegaly Respiratory: clear to auscultation bilaterally, no wheezing, no crackles. Normal respiratory effort. No accessory muscle use.  Cardiovascular: Regular rate and rhythm, no murmurs / rubs / gallops. No extremity edema.  Abdomen: Suprapubic tenderness, no masses palpated. No hepatosplenomegaly. Bowel sounds positive.  Musculoskeletal: no clubbing / cyanosis. No joint deformity upper and lower extremities.  Skin: no rashes, lesions, ulcers. No induration Neurologic: CN 2-12 grossly intact.  Strength 5/5 in all 4.  Psychiatric: Normal judgment and insight. Alert and oriented x 3. Normal mood.   Foley Catheter:None  Labs on Admission: I have personally reviewed following labs and imaging studies  CBC: Recent Labs  Lab 01/08/20 1050  WBC 5.8  NEUTROABS 4.3  HGB 11.0*  HCT 33.2*  MCV 99.4  PLT 417   Basic Metabolic Panel: Recent Labs  Lab 01/08/20 1050  NA 123*  K 4.6  CL 92*  CO2 24  GLUCOSE 124*  BUN 19  CREATININE 0.60  CALCIUM 8.6*   GFR: Estimated Creatinine Clearance: 40.5 mL/min (by C-G formula based on SCr of 0.6 mg/dL). Liver Function Tests: No results for input(s): AST, ALT, ALKPHOS, BILITOT, PROT, ALBUMIN in the last 168 hours. No results for input(s): LIPASE, AMYLASE in the last 168 hours. No results for input(s): AMMONIA in the last 168 hours. Coagulation Profile: No results for input(s): INR, PROTIME in the last 168 hours. Cardiac Enzymes: No results for input(s): CKTOTAL, CKMB, CKMBINDEX, TROPONINI in the last 168 hours. BNP (last 3 results) No results for input(s): PROBNP in the last 8760 hours. HbA1C: No results for input(s): HGBA1C in the last 72 hours. CBG: No results for input(s): GLUCAP in the last 168 hours. Lipid Profile: No results for input(s): CHOL, HDL, LDLCALC, TRIG, CHOLHDL, LDLDIRECT in the last 72 hours. Thyroid Function Tests: No results for  input(s): TSH, T4TOTAL, FREET4, T3FREE, THYROIDAB in the last 72 hours. Anemia Panel: No results for input(s): VITAMINB12, FOLATE, FERRITIN, TIBC, IRON, RETICCTPCT in the last 72 hours. Urine analysis:    Component Value Date/Time   COLORURINE YELLOW 01/08/2020 1014   APPEARANCEUR TURBID (A) 01/08/2020 1014   LABSPEC 1.013 01/08/2020 1014   PHURINE 6.0 01/08/2020 1014   GLUCOSEU NEGATIVE 01/08/2020 1014   HGBUR SMALL (A) 01/08/2020 1014   BILIRUBINUR NEGATIVE 01/08/2020 1014   KETONESUR NEGATIVE 01/08/2020 1014   PROTEINUR 100 (A) 01/08/2020 1014   UROBILINOGEN 0.2 05/23/2008 0949   NITRITE NEGATIVE 01/08/2020 1014   LEUKOCYTESUR LARGE (A) 01/08/2020 1014    Radiological Exams on Admission: CT ABDOMEN PELVIS W CONTRAST  Result Date: 01/08/2020 CLINICAL DATA:  Right lower quadrant abdominal pain. EXAM: CT ABDOMEN AND PELVIS WITH CONTRAST TECHNIQUE: Multidetector CT imaging of the abdomen  and pelvis was performed using the standard protocol following bolus administration of intravenous contrast. CONTRAST:  19mL OMNIPAQUE IOHEXOL 300 MG/ML  SOLN COMPARISON:  December 07, 2014. FINDINGS: Lower chest: No acute abnormality. Hepatobiliary: Status post cholecystectomy. Mild intrahepatic and extrahepatic biliary dilatation is noted most likely due to post cholecystectomy status. Liver is otherwise unremarkable. Pancreas: Unremarkable. No pancreatic ductal dilatation or surrounding inflammatory changes. Spleen: Multiple small calcified splenic granulomata are noted. No other abnormality seen involving the spleen. Adrenals/Urinary Tract: Adrenal glands appear normal. Bilateral renal cysts are noted. Mild left hydronephrosis is noted without obstructing calculus, concerning for some degree of ureteropelvic junction stenosis. No renal or ureteral calculi are noted. Mild urinary bladder distention is noted. Stomach/Bowel: The stomach appears normal. There is no evidence of bowel obstruction or  inflammation. The appendix is not clearly visualized, but no inflammation is noted in the right lower quadrant. Stool is noted throughout the colon. Vascular/Lymphatic: Aortic atherosclerosis. No enlarged abdominal or pelvic lymph nodes. Reproductive: Status post hysterectomy. No adnexal masses. Other: No abdominal wall hernia or abnormality. No abdominopelvic ascites. Musculoskeletal: Severe multilevel degenerative disc disease is noted in the lumbar spine. No acute osseous abnormality is noted. IMPRESSION: 1. Mild left hydronephrosis is noted without obstructing calculus, concerning for some degree of ureteropelvic junction stenosis. 2. Mild urinary bladder distention is noted. 3. No other significant abnormality seen in the abdomen or pelvis. Aortic Atherosclerosis (ICD10-I70.0). Electronically Signed   By: Marijo Conception M.D.   On: 01/08/2020 16:44     Assessment/Plan Principal Problem:   AMS (altered mental status) Active Problems:   Diabetes mellitus ([facility])   HTN (hypertension)   Acute lower UTI   Hyponatremia  Altered mental status: Most likely secondary to metabolic encephalopathy from  Confused when EMS found her at home.  Alert and oriented during evaluation in the ED.  Monitor mental status.  Sepsis/urinary tract infection: We will follow up urine culture, blood culture.  UA please suggestive of UTI.  She was complaining of severe dysuria/lower abdominal pain and increased frequency of urination.  Started on ceftriaxone.  Continue gentle IV fluids CT abdomen/pelvis showed mild left hydronephrosis  without obstructing calculus, some degree of ureteropelvic junction stenosis,mild urinary bladder distention.  She needs follow-up with urology as an outpatient.  Hyponatremia: Sodium of 123.  Most likely this is hypovolemic hyponatremia from decreased oral intake from UTI.  We will also check SIADH panel.  Continue IV fluids and reassess tomorrow.  Diabetes type 2: Takes Metformin at home.   Monitor blood sugars.  Continue sliding scale insulin  Hypertension: Currently normotensive.  Takes lisinopril at home.  Advanced age, debility/deconditioning: PT evaluation when appropriate.  Severity of Illness: The appropriate patient status for this patient is OBSERVATION.    DVT prophylaxis: Lovenox Code Status: Full Family Communication: Niece at beside Consults called: None     Shelly Coss MD [facility]  01/08/2020, 5:27 PM

## 2020-01-08 NOTE — ED Notes (Signed)
Pt ambulatory to RR w/ one person assist.  Pt had soiled bedding, so bedding was changed and pt was assisted with getting back into the bed. Pt reconnected to all v/s monitoring and new purewick placed on pt . Pt was given warm blankets and was resting comfortably.

## 2020-01-08 NOTE — ED Notes (Signed)
Pt ambulated again to/from restroom with primary RN with minimal assistance. Pt experiencing frequent urination in ED.

## 2020-01-08 NOTE — ED Notes (Signed)
Pt ambulated with minimal assistance to and from restroom. Assisted by ED tech, Opal Sidles.

## 2020-01-09 DIAGNOSIS — I10 Essential (primary) hypertension: Secondary | ICD-10-CM | POA: Diagnosis present

## 2020-01-09 DIAGNOSIS — N390 Urinary tract infection, site not specified: Secondary | ICD-10-CM | POA: Diagnosis present

## 2020-01-09 DIAGNOSIS — Z9049 Acquired absence of other specified parts of digestive tract: Secondary | ICD-10-CM | POA: Diagnosis not present

## 2020-01-09 DIAGNOSIS — Z8542 Personal history of malignant neoplasm of other parts of uterus: Secondary | ICD-10-CM | POA: Diagnosis not present

## 2020-01-09 DIAGNOSIS — N3289 Other specified disorders of bladder: Secondary | ICD-10-CM | POA: Diagnosis present

## 2020-01-09 DIAGNOSIS — Z7984 Long term (current) use of oral hypoglycemic drugs: Secondary | ICD-10-CM | POA: Diagnosis not present

## 2020-01-09 DIAGNOSIS — B964 Proteus (mirabilis) (morganii) as the cause of diseases classified elsewhere: Secondary | ICD-10-CM | POA: Diagnosis present

## 2020-01-09 DIAGNOSIS — G9341 Metabolic encephalopathy: Secondary | ICD-10-CM | POA: Diagnosis present

## 2020-01-09 DIAGNOSIS — Z90710 Acquired absence of both cervix and uterus: Secondary | ICD-10-CM | POA: Diagnosis not present

## 2020-01-09 DIAGNOSIS — E119 Type 2 diabetes mellitus without complications: Secondary | ICD-10-CM | POA: Diagnosis present

## 2020-01-09 DIAGNOSIS — N136 Pyonephrosis: Secondary | ICD-10-CM | POA: Diagnosis present

## 2020-01-09 DIAGNOSIS — R404 Transient alteration of awareness: Secondary | ICD-10-CM | POA: Diagnosis not present

## 2020-01-09 DIAGNOSIS — E861 Hypovolemia: Secondary | ICD-10-CM | POA: Diagnosis present

## 2020-01-09 DIAGNOSIS — E222 Syndrome of inappropriate secretion of antidiuretic hormone: Secondary | ICD-10-CM | POA: Diagnosis present

## 2020-01-09 DIAGNOSIS — R5381 Other malaise: Secondary | ICD-10-CM | POA: Diagnosis present

## 2020-01-09 DIAGNOSIS — Z79899 Other long term (current) drug therapy: Secondary | ICD-10-CM | POA: Diagnosis not present

## 2020-01-09 DIAGNOSIS — Z20822 Contact with and (suspected) exposure to covid-19: Secondary | ICD-10-CM | POA: Diagnosis present

## 2020-01-09 LAB — BASIC METABOLIC PANEL
Anion gap: 10 (ref 5–15)
BUN: 7 mg/dL — ABNORMAL LOW (ref 8–23)
CO2: 21 mmol/L — ABNORMAL LOW (ref 22–32)
Calcium: 8.3 mg/dL — ABNORMAL LOW (ref 8.9–10.3)
Chloride: 96 mmol/L — ABNORMAL LOW (ref 98–111)
Creatinine, Ser: 0.38 mg/dL — ABNORMAL LOW (ref 0.44–1.00)
GFR, Estimated: >60 mL/min (ref >60)
Glucose, Bld: 108 mg/dL — ABNORMAL HIGH (ref 70–99)
Potassium: 3.9 mmol/L (ref 3.5–5.1)
Sodium: 127 mmol/L — ABNORMAL LOW (ref 135–145)

## 2020-01-09 LAB — CBC
HCT: 36.3 % (ref 36.0–46.0)
Hemoglobin: 11.8 g/dL — ABNORMAL LOW (ref 12.0–15.0)
MCH: 32.5 pg (ref 26.0–34.0)
MCHC: 32.5 g/dL (ref 30.0–36.0)
MCV: 100 fL (ref 80.0–100.0)
Platelets: 258 10*3/uL (ref 150–400)
RBC: 3.63 MIL/uL — ABNORMAL LOW (ref 3.87–5.11)
RDW: 12.5 % (ref 11.5–15.5)
WBC: 7.5 10*3/uL (ref 4.0–10.5)
nRBC: 0 % (ref 0.0–0.2)

## 2020-01-09 LAB — GLUCOSE, CAPILLARY
Glucose-Capillary: 111 mg/dL — ABNORMAL HIGH (ref 70–99)
Glucose-Capillary: 354 mg/dL — ABNORMAL HIGH (ref 70–99)
Glucose-Capillary: 75 mg/dL (ref 70–99)
Glucose-Capillary: 183 mg/dL — ABNORMAL HIGH (ref 70–99)

## 2020-01-09 MED ORDER — ACETAMINOPHEN 500 MG PO TABS
500.0000 mg | ORAL_TABLET | Freq: Four times a day (QID) | ORAL | Status: DC | PRN
  Administered 2020-01-09: 500 mg via ORAL
  Filled 2020-01-09: qty 1

## 2020-01-09 MED ORDER — ADULT MULTIVITAMIN W/MINERALS CH
1.0000 | ORAL_TABLET | Freq: Every day | ORAL | Status: DC
  Administered 2020-01-10: 1 via ORAL
  Filled 2020-01-09: qty 1

## 2020-01-09 MED ORDER — PROSOURCE PLUS PO LIQD
30.0000 mL | Freq: Two times a day (BID) | ORAL | Status: DC
  Administered 2020-01-09 – 2020-01-10 (×2): 30 mL via ORAL
  Filled 2020-01-09 (×2): qty 30

## 2020-01-09 MED ORDER — SODIUM CHLORIDE 1 G PO TABS
1.0000 g | ORAL_TABLET | Freq: Two times a day (BID) | ORAL | Status: DC
  Administered 2020-01-09 – 2020-01-10 (×3): 1 g via ORAL
  Filled 2020-01-09 (×4): qty 1

## 2020-01-09 NOTE — Progress Notes (Signed)
PROGRESS NOTE    [name]  DDU:[id number] DOB: September 14, 1933 DOA: 01/08/2020 PCP: Megan Stagers, MD   Chief Complain: Dysuria  Brief Narrative: [name] is a 86 y.o. female with medical history significant of hypertension, diabetes who was brought to the emergency department by EMS for the evaluation of painful urination, confusion.  Patient was having lower abdominal pain and frequent urination, chills.  CT abdomen/pelvis showed mild left hydronephrosis without obstructing calculus, mild urinary bladder distention, possible ureteropelvic junction stenosis.  UA was suggestive of UTI.  Patient was admitted for the management of UTI.  Also hyponatremic on presentation.  Assessment & Plan:   Principal Problem:   AMS (altered mental status) Active Problems:   Diabetes mellitus (HCC)   HTN (hypertension)   Acute lower UTI   Hyponatremia   Altered mental status:resolved. Most likely secondary to metabolic encephalopathy from UTI.  Confused when EMS found her at home.  Alert and oriented now.    Sepsis/urinary tract infection: We will follow up urine culture, blood culture.  UA was suggestive of UTI.  She was complaining of severe dysuria/lower abdominal pain and increased frequency of urination.  Started on ceftriaxone.  Continue gentle IV fluids CT abdomen/pelvis showed mild left hydronephrosis  without obstructing calculus, some degree of ureteropelvic junction stenosis,mild urinary bladder distention.  She needs follow-up with urology as an outpatient.  Hyponatremia: Sodium of 123.  Suspected to be from hypovolemic hyponatremia from decreased oral intake from UTI.  But lab studies suggests SIADH .  Will recheck fluid restriction, also start on salt tablets Na improved to 127 today.  Diabetes type 2: Takes Metformin at home.  Monitor blood sugars.  Continue sliding scale insulin  Hypertension: Currently normotensive.  Takes lisinopril at home.  Advanced  age, debility/deconditioning: PT evaluation requested.         DVT prophylaxis:Lovenox Code Status: Full Family Communication: Called and discussed with niece at bedside on 01/08/20 Status is: Observation   Dispo: The patient is from: Home              Anticipated d/c is to: Home              Anticipated d/c date is: 1 day              Patient currently is not medically stable to d/c. Urine Culture pending.  Sodium is still 127    Consultants: None Procedures:None  Antimicrobials:  Anti-infectives (From admission, onward)   Start     Dose/Rate Route Frequency Ordered Stop   01/09/20 1000  cefTRIAXone (ROCEPHIN) 1 g in sodium chloride 0.9 % 100 mL IVPB        1 g 200 mL/hr over 30 Minutes Intravenous Every 24 hours 01/08/20 1726     01/08/20 1715  cefTRIAXone (ROCEPHIN) 1 g in sodium chloride 0.9 % 100 mL IVPB        1 g 200 mL/hr over 30 Minutes Intravenous  Once 01/08/20 1708 01/08/20 2007      Subjective:  Patient seen and examined at the bedside this morning. Hemodynamically  stable. She  Is more comfortable today.  Less abdominal pain but not completely gone and less frequency of urination.  Alert and oriented.  Objective: Vitals:   01/08/20 2216 01/08/20 2300 01/09/20 0000 01/09/20 0430  BP:  129/70 112/67 (!) 106/56  Pulse:  94 77 78  Resp:  17 19 14   Temp: 98.4 F (36.9 C)  98 F (36.7 C) 98.2  F (36.8 C)  TempSrc: Oral  Oral Axillary  SpO2:  96% 100% 98%  Weight:      Height:        Intake/Output Summary (Last 24 hours) at 01/09/2020 0751 Last data filed at 01/09/2020 0345 Gross per 24 hour  Intake 879.92 ml  Output 600 ml  Net 279.92 ml   Filed Weights   01/08/20 0935  Weight: 49 kg    Examination:  General exam: Appears calm and comfortable , pleasant elderly female HEENT:PERRL,Oral mucosa moist, Ear/Nose normal on gross exam Respiratory system: Bilateral equal air entry, normal vesicular breath sounds, no wheezes or crackles    Cardiovascular system: S1 & S2 heard, RRR. No JVD, murmurs, rubs, gallops or clicks. No pedal edema. Gastrointestinal system: Abdomen is nondistended, soft and has mild suprapubic tenderness. No organomegaly or masses felt. Normal bowel sounds heard. Central nervous system: Alert and oriented. No focal neurological deficits. Extremities: No edema, no clubbing ,no cyanosis Skin: No rashes, lesions or ulcers,no icterus ,no pallor   Data Reviewed: I have personally reviewed following labs and imaging studies  CBC: Recent Labs  Lab 01/08/20 1050 01/09/20 0309  WBC 5.8 7.5  NEUTROABS 4.3  --   HGB 11.0* 11.8*  HCT 33.2* 36.3  MCV 99.4 100.0  PLT 266 829   Basic Metabolic Panel: Recent Labs  Lab 01/08/20 1050 01/09/20 0309  NA 123* 127*  K 4.6 3.9  CL 92* 96*  CO2 24 21*  GLUCOSE 124* 108*  BUN 19 7*  CREATININE 0.60 0.38*  CALCIUM 8.6* 8.3*   GFR: Estimated Creatinine Clearance: 40.5 mL/min (A) (by C-G formula based on SCr of 0.38 mg/dL (L)). Liver Function Tests: No results for input(s): AST, ALT, ALKPHOS, BILITOT, PROT, ALBUMIN in the last 168 hours. No results for input(s): LIPASE, AMYLASE in the last 168 hours. No results for input(s): AMMONIA in the last 168 hours. Coagulation Profile: No results for input(s): INR, PROTIME in the last 168 hours. Cardiac Enzymes: No results for input(s): CKTOTAL, CKMB, CKMBINDEX, TROPONINI in the last 168 hours. BNP (last 3 results) No results for input(s): PROBNP in the last 8760 hours. HbA1C: No results for input(s): HGBA1C in the last 72 hours. CBG: Recent Labs  Lab 01/08/20 2156  GLUCAP 100*   Lipid Profile: No results for input(s): CHOL, HDL, LDLCALC, TRIG, CHOLHDL, LDLDIRECT in the last 72 hours. Thyroid Function Tests: Recent Labs    01/08/20 1934  TSH 1.563   Anemia Panel: No results for input(s): VITAMINB12, FOLATE, FERRITIN, TIBC, IRON, RETICCTPCT in the last 72 hours. Sepsis Labs: No results for input(s):  PROCALCITON, LATICACIDVEN in the last 168 hours.  Recent Results (from the past 240 hour(s))  Resp Panel by RT PCR (RSV, Flu A&B, Covid) - Nasopharyngeal Swab     Status: None   Collection Time: 01/08/20  7:34 PM   Specimen: Nasopharyngeal Swab  Result Value Ref Range Status   SARS Coronavirus 2 by RT PCR NEGATIVE NEGATIVE Final    Comment: (NOTE) SARS-CoV-2 target nucleic acids are NOT DETECTED.  The SARS-CoV-2 RNA is generally detectable in upper respiratoy specimens during the acute phase of infection. The lowest concentration of SARS-CoV-2 viral copies this assay can detect is 131 copies/mL. A negative result does not preclude SARS-Cov-2 infection and should not be used as the sole basis for treatment or other patient management decisions. A negative result may occur with  improper specimen collection/handling, submission of specimen other than nasopharyngeal swab, presence of viral mutation(s)  within the areas targeted by this assay, and inadequate number of viral copies (<131 copies/mL). A negative result must be combined with clinical observations, patient history, and epidemiological information. The expected result is Negative.  Fact Sheet for Patients:  PinkCheek.be  Fact Sheet for Healthcare Providers:  GravelBags.it  This test is no t yet approved or cleared by the [place] FDA and  has been authorized for detection and/or diagnosis of SARS-CoV-2 by FDA under an Emergency Use Authorization (EUA). This EUA will remain  in effect (meaning this test can be used) for the duration of the COVID-19 declaration under Section 564(b)(1) of the Act, 21 U.S.C. section 360bbb-3(b)(1), unless the authorization is terminated or revoked sooner.     Influenza A by PCR NEGATIVE NEGATIVE Final   Influenza B by PCR NEGATIVE NEGATIVE Final    Comment: (NOTE) The Xpert Xpress SARS-CoV-2/FLU/RSV assay is intended as an aid  in  the diagnosis of influenza from Nasopharyngeal swab specimens and  should not be used as a sole basis for treatment. Nasal washings and  aspirates are unacceptable for Xpert Xpress SARS-CoV-2/FLU/RSV  testing.  Fact Sheet for Patients: PinkCheek.be  Fact Sheet for Healthcare Providers: GravelBags.it  This test is not yet approved or cleared by the [place] FDA and  has been authorized for detection and/or diagnosis of SARS-CoV-2 by  FDA under an Emergency Use Authorization (EUA). This EUA will remain  in effect (meaning this test can be used) for the duration of the  Covid-19 declaration under Section 564(b)(1) of the Act, 21  U.S.C. section 360bbb-3(b)(1), unless the authorization is  terminated or revoked.    Respiratory Syncytial Virus by PCR NEGATIVE NEGATIVE Final    Comment: (NOTE) Fact Sheet for Patients: PinkCheek.be  Fact Sheet for Healthcare Providers: GravelBags.it  This test is not yet approved or cleared by the [place] FDA and  has been authorized for detection and/or diagnosis of SARS-CoV-2 by  FDA under an Emergency Use Authorization (EUA). This EUA will remain  in effect (meaning this test can be used) for the duration of the  COVID-19 declaration under Section 564(b)(1) of the Act, 21 U.S.C.  section 360bbb-3(b)(1), unless the authorization is terminated or  revoked. Performed at [facility], [place] [address] [place], [place] [address]          Radiology Studies: CT ABDOMEN PELVIS W CONTRAST  Result Date: 01/08/2020 CLINICAL DATA:  Right lower quadrant abdominal pain. EXAM: CT ABDOMEN AND PELVIS WITH CONTRAST TECHNIQUE: Multidetector CT imaging of the abdomen and pelvis was performed using the standard protocol following bolus administration of intravenous contrast. CONTRAST:  161mL OMNIPAQUE IOHEXOL 300  MG/ML  SOLN COMPARISON:  December 07, 2014. FINDINGS: Lower chest: No acute abnormality. Hepatobiliary: Status post cholecystectomy. Mild intrahepatic and extrahepatic biliary dilatation is noted most likely due to post cholecystectomy status. Liver is otherwise unremarkable. Pancreas: Unremarkable. No pancreatic ductal dilatation or surrounding inflammatory changes. Spleen: Multiple small calcified splenic granulomata are noted. No other abnormality seen involving the spleen. Adrenals/Urinary Tract: Adrenal glands appear normal. Bilateral renal cysts are noted. Mild left hydronephrosis is noted without obstructing calculus, concerning for some degree of ureteropelvic junction stenosis. No renal or ureteral calculi are noted. Mild urinary bladder distention is noted. Stomach/Bowel: The stomach appears normal. There is no evidence of bowel obstruction or inflammation. The appendix is not clearly visualized, but no inflammation is noted in the right lower quadrant. Stool is noted throughout the colon. Vascular/Lymphatic: Aortic atherosclerosis. No enlarged  abdominal or pelvic lymph nodes. Reproductive: Status post hysterectomy. No adnexal masses. Other: No abdominal wall hernia or abnormality. No abdominopelvic ascites. Musculoskeletal: Severe multilevel degenerative disc disease is noted in the lumbar spine. No acute osseous abnormality is noted. IMPRESSION: 1. Mild left hydronephrosis is noted without obstructing calculus, concerning for some degree of ureteropelvic junction stenosis. 2. Mild urinary bladder distention is noted. 3. No other significant abnormality seen in the abdomen or pelvis. Aortic Atherosclerosis (ICD10-I70.0). Electronically Signed   By: Marijo Conception M.D.   On: 01/08/2020 16:44        Scheduled Meds: . insulin aspart  0-5 Units Subcutaneous QHS  . insulin aspart  0-9 Units Subcutaneous TID WC  . latanoprost  1 drop Both Eyes QHS  . lisinopril  5 mg Oral Daily  . phenazopyridine   100 mg Oral TID WC  . sodium chloride  1 g Oral BID WC   Continuous Infusions: . cefTRIAXone (ROCEPHIN)  IV       LOS: 0 days    Time spent:25 mins. More than 50% of that time was spent in counseling and/or coordination of care.      Shelly Coss, MD [facility] P10/21/2021, 7:51 AM

## 2020-01-09 NOTE — Evaluation (Signed)
Physical Therapy Evaluation Patient Details Name: [name] MRN: [id number] DOB: 1933-10-05 Today's Date: 01/09/2020   History of Present Illness  Pt is 86 yo female with PMH including HTN and DM.  Pt admitted with AMS, UTI, and hyponatremia.  Clinical Impression  Pt admitted with above diagnosis. Pt presenting with mild deficits in mobility, strength, endurance, safety , and balance.  She lives alone and is normally very independent.  Pt has intermittent support from her niece and necessary DME at home.  Pt reports 1 fall in the past year.  Today, she was able to ambulate 400' with min assist to control the IV pole.  Try to progress to quad cane or RW next visit.  Pt currently with functional limitations due to the deficits listed below (see PT Problem List). Pt will benefit from skilled PT to increase their independence and safety with mobility to allow discharge to home with intermittent support.       Follow Up Recommendations No PT follow up; home with intermittent support    Equipment Recommendations  None recommended by PT    Recommendations for Other Services       Precautions / Restrictions Precautions Precautions: Fall      Mobility  Bed Mobility Overal bed mobility: Needs Assistance Bed Mobility: Supine to Sit;Sit to Supine     Supine to sit: Supervision Sit to supine: Supervision        Transfers Overall transfer level: Needs assistance Equipment used: None Transfers: Sit to/from Stand Sit to Stand: Supervision         General transfer comment: sit to stand x 2  Ambulation/Gait Ambulation/Gait assistance: Min assist Gait Distance (Feet): 400 Feet Assistive device: IV Pole Gait Pattern/deviations: Step-through pattern;Decreased stride length;Drifts right/left     General Gait Details: min guard for balance and min A to control IV pole  Stairs            Wheelchair Mobility    Modified Rankin (Stroke Patients Only)       Balance  Overall balance assessment: Needs assistance   Sitting balance-Leahy Scale: Normal     Standing balance support: No upper extremity supported Standing balance-Leahy Scale: Good                               Pertinent Vitals/Pain Pain Assessment: No/denies pain    Home Living Family/patient expects to be discharged to:: Private residence Living Arrangements: Alone (with her dog) Available Help at Discharge: Family;Available PRN/intermittently (niece) Type of Home: House Home Access: Other (comment) (threshold)     Home Layout: One level Home Equipment: Grab bars - tub/shower;Shower seat;Bedside commode;Cane - quad;Walker - 2 wheels      Prior Function Level of Independence: Independent with assistive device(s)         Comments: Reports independent with ADLs, IADLs, driving and community ambulation with quad cane. Reports even still push mows yard. Reports 1 fall in last year     Hand Dominance        Extremity/Trunk Assessment   Upper Extremity Assessment Upper Extremity Assessment: LUE deficits/detail;RUE deficits/detail RUE Deficits / Details: ROM WFL; MMT 4/5 LUE Deficits / Details: ROM WFL; MMT 4/5    Lower Extremity Assessment Lower Extremity Assessment: LLE deficits/detail;RLE deficits/detail RLE Deficits / Details: ROM WFL; MMT 5/5 LLE Deficits / Details: ROM WFL; MMT 5/5    Cervical / Trunk Assessment Cervical / Trunk Assessment: Normal  Communication  Communication: HOH  Cognition Arousal/Alertness: Awake/alert Behavior During Therapy: WFL for tasks assessed/performed Overall Cognitive Status: Within Functional Limits for tasks assessed                                        General Comments General comments (skin integrity, edema, etc.): VSS    Exercises     Assessment/Plan    PT Assessment Patient needs continued PT services  PT Problem List Decreased strength;Decreased mobility;Decreased activity  tolerance;Decreased balance;Decreased knowledge of use of DME       PT Treatment Interventions DME instruction;Therapeutic exercise;Gait training;Balance training;Stair training;Functional mobility training;Therapeutic activities;Patient/family education    PT Goals (Current goals can be found in the Care Plan section)  Acute Rehab PT Goals Patient Stated Goal: return home PT Goal Formulation: With patient Time For Goal Achievement: 01/23/20 Potential to Achieve Goals: Good    Frequency Min 3X/week   Barriers to discharge Decreased caregiver support      Co-evaluation               AM-PAC PT "6 Clicks" Mobility  Outcome Measure Help needed turning from your back to your side while in a flat bed without using bedrails?: None Help needed moving from lying on your back to sitting on the side of a flat bed without using bedrails?: None Help needed moving to and from a bed to a chair (including a wheelchair)?: None Help needed standing up from a chair using your arms (e.g., wheelchair or bedside chair)?: None Help needed to walk in hospital room?: A Little Help needed climbing 3-5 steps with a railing? : A Little 6 Click Score: 22    End of Session Equipment Utilized During Treatment: Gait belt Activity Tolerance: Patient tolerated treatment well Patient left: in bed;with call bell/phone within reach;with bed alarm set Nurse Communication: Mobility status PT Visit Diagnosis: Other abnormalities of gait and mobility (R26.89)    Time: 0263-7858 PT Time Calculation (min) (ACUTE ONLY): 20 min   Charges:   PT Evaluation $PT Eval Low Complexity: 1 Low          Nikita Humble, PT Acute Rehab Services Pager [phone] [facility] [phone]    Karlton Lemon 01/09/2020, 4:14 PM

## 2020-01-09 NOTE — Progress Notes (Signed)
Initial Nutrition Assessment  RD working remotely.  DOCUMENTATION CODES:   Not applicable  INTERVENTION:  - will order order 30 ml Prosource Plus BID, each supplement provides 100 kcal and 15 grams protein.  - will order Magic Cup BID with meals, each supplement provides 290 kcal and 9 grams of protein. - will order 1 tablet multivitamin with minerals/day.   NUTRITION DIAGNOSIS:   Increased nutrient needs related to acute illness as evidenced by estimated needs.  GOAL:   Patient will meet greater than or equal to 90% of their needs  MONITOR:   PO intake, Supplement acceptance, Labs, Weight trends  ASSESSMENT:   86 y.o. female with medical history of HTN, DM, peripheral neuropathy, and cancer. She presented to the ED due to painful urination, lower abdominal pain, chills, and confusion. CT abdomen/pelvis showed mild L hydronephrosis without obstructing calculus, mild urinary bladder distention, possible ureteropelvic junction stenosis. UA was suggestive of UTI.  Patient was admitted for the management of UTI.  She ate 100% of both breakfast and lunch today. Weight yesterday was 108 lb and it appears that weight has been stable for the past 9 months; weight on 03/27/19 was 110 lb. No information documented in the edema section of flow sheet.  Per notes: - AMS--resolved - sepsis - hyponatremia thought to be 2/2 hypovolemia - from home and likely plan to return home at d/c    Labs reviewed; CBGs: 111 and 354 mg/dl, Na: 127 mmol/l, Cl: 96 mmol/l, BUN: 7 mg/dl, creatinine: 0.38 mg/dl, Ca: 8.3 mg/dl. Medications reviewed; sliding scale novolog, 1 g NaCl BID.     NUTRITION - FOCUSED PHYSICAL EXAM:  unable to complete at this time.   Diet Order:   Diet Order            Diet Carb Modified Fluid consistency: Thin; Room service appropriate? Yes  Diet effective now                 EDUCATION NEEDS:   No education needs have been identified at this time  Skin:  Skin  Assessment: Reviewed RN Assessment  Last BM:  10/21 (type 3)  Height:   Ht Readings from Last 1 Encounters:  01/08/20 5\' 3"  (1.6 m)    Weight:   Wt Readings from Last 1 Encounters:  01/08/20 49 kg     Estimated Nutritional Needs:  Kcal:  1470-1710 kcal Protein:  70-80 grams Fluid:  >/= 2 L/day      Jarome Matin, MS, RD, LDN, CNSC Inpatient Clinical Dietitian RD pager # available in [facility]  After hours/weekend pager # available in [facility]

## 2020-01-10 DIAGNOSIS — R404 Transient alteration of awareness: Secondary | ICD-10-CM | POA: Diagnosis not present

## 2020-01-10 LAB — URINE CULTURE: Culture: >=100000 — AB

## 2020-01-10 LAB — BASIC METABOLIC PANEL
Anion gap: 8 (ref 5–15)
BUN: 8 mg/dL (ref 8–23)
CO2: 23 mmol/L (ref 22–32)
Calcium: 8.4 mg/dL — ABNORMAL LOW (ref 8.9–10.3)
Chloride: 101 mmol/L (ref 98–111)
Creatinine, Ser: 0.31 mg/dL — ABNORMAL LOW (ref 0.44–1.00)
GFR, Estimated: >60 mL/min (ref >60)
Glucose, Bld: 113 mg/dL — ABNORMAL HIGH (ref 70–99)
Potassium: 3.9 mmol/L (ref 3.5–5.1)
Sodium: 132 mmol/L — ABNORMAL LOW (ref 135–145)

## 2020-01-10 LAB — GLUCOSE, CAPILLARY
Glucose-Capillary: 151 mg/dL — ABNORMAL HIGH (ref 70–99)
Glucose-Capillary: 122 mg/dL — ABNORMAL HIGH (ref 70–99)

## 2020-01-10 MED ORDER — PHENAZOPYRIDINE HCL 100 MG PO TABS: 100.0000 mg | ORAL_TABLET | Freq: Three times a day (TID) | ORAL | 0 refills | Status: AC | PRN

## 2020-01-10 MED ORDER — CIPROFLOXACIN HCL 500 MG PO TABS
500.0000 mg | ORAL_TABLET | Freq: Two times a day (BID) | ORAL | Status: DC
  Administered 2020-01-10: 500 mg via ORAL
  Filled 2020-01-10: qty 1

## 2020-01-10 MED ORDER — CIPROFLOXACIN HCL 500 MG PO TABS: 500.0000 mg | ORAL_TABLET | Freq: Two times a day (BID) | ORAL | 0 refills | Status: AC

## 2020-01-10 NOTE — Plan of Care (Signed)
Plan of care reviewed and discussed with the patient. 

## 2020-01-10 NOTE — Discharge Summary (Addendum)
Physician Discharge Summary  [name] BDZ:[id number] DOB: 1933-08-06 DOA: 01/08/2020  PCP: Aretta Nip, MD  Admit date: 01/08/2020 Discharge date: 01/10/2020  Admitted From: Home Disposition:  Home  Discharge Condition:Stable CODE STATUS:FULL Diet recommendation: Heart Healthy   Brief/Interim Summary:  Moet B Blaylockis a 86 y.o.femalewith medical history significant ofhypertension, diabetes who was brought to the emergency department by EMS for the evaluation of painful urination, confusion.  Patient was having lower abdominal pain and frequent urination, chills.  CT abdomen/pelvis showed mild left hydronephrosis without obstructing calculus, mild urinary bladder distention, possible ureteropelvic junction stenosis.  UA was suggestive of UTI.  Patient was admitted for the management of UTI and started on IV abx.  She was also hyponatremic on presentation.  Her symptoms have significantly improved.  Urine culture showed Proteus.  Antibiotics changed to oral today and she is medically stable for discharge.  Following problems were addressed during hospitalization:  Altered mental status:resolved. Most likely secondary to metabolic encephalopathy from UTI. Confused when EMS found her at home.Alert and oriented now.  urinary tract infection: UA wassuggestive of UTI. Shewascomplaining ofseveredysuria/lower abdominal painand increased frequency of urination. Started on ceftriaxone. She was given iV fluids CT abdomen/pelvis showedmild left hydronephrosis without obstructing calculus, some degree of ureteropelvic junction stenosis,mild urinary bladder distention.She needsfollow-up with urology as an outpatient. Urine culture showed Proteus [name], antibiotics changed to ciprofloxacin.  Hyponatremia: Sodium of 123. Suspected to be from hypovolemic hyponatremia from decreased oral intake from UTI.But lab studies suggested SIADH . Started fluid restriction,  also start on salt tablets Na improved to 132 today.  Diabetes type 2: Takes Metformin at home.   Hypertension:Currently normotensive. Takes lisinopril at home.  Advanced age, debility/deconditioning:PT evaluation requested no f/u recommended.   Discharge Diagnoses:  Principal Problem:   AMS (altered mental status) Active Problems:   Diabetes mellitus (HCC)   HTN (hypertension)   Acute lower UTI   Hyponatremia   UTI (urinary tract infection)    Discharge Instructions  Discharge Instructions    Diet - low sodium heart healthy   Complete by: As directed    Discharge instructions   Complete by: As directed    1)Please follow up with your PCP in a week. 2)Follow up with urology as an outpatient in 2 weeks.  Name and number of the provider group has been attached 3)Take prescribed medications as instructed   Increase activity slowly   Complete by: As directed      Allergies as of 01/10/2020      Reactions   Codeine    dizziness      Medication List    TAKE these medications   acetaminophen 500 MG tablet Commonly known as: TYLENOL Take 1,000 mg by mouth every 8 (eight) hours as needed for mild pain.   ciprofloxacin 500 MG tablet Commonly known as: CIPRO Take 1 tablet (500 mg total) by mouth 2 (two) times daily for 5 days.   latanoprost 0.005 % ophthalmic solution Commonly known as: XALATAN Place 1 drop into both eyes at bedtime.   metFORMIN 500 MG tablet Commonly known as: GLUCOPHAGE   phenazopyridine 100 MG tablet Commonly known as: PYRIDIUM Take 1 tablet (100 mg total) by mouth 3 (three) times daily as needed for pain (lower abd pain/dysuria).   PRESERVISION AREDS 2+MULTI VIT PO Take 1 tablet by mouth in the morning and at bedtime.   quinapril 5 MG tablet Commonly known as: ACCUPRIL Take 5 mg by mouth daily.  Follow-up Information    Rankins, Bill Salinas, MD. Schedule an appointment as soon as possible for a visit in 1 week(s).    Specialty: Family Medicine Contact information: [place] [address] [phone]        [facility]. Schedule an appointment as soon as possible for a visit in 2 week(s).   Contact information: [place] [phone]             Allergies  Allergen Reactions  . Codeine     dizziness    Consultations:  None   Procedures/Studies: CT ABDOMEN PELVIS W CONTRAST  Result Date: 01/08/2020 CLINICAL DATA:  Right lower quadrant abdominal pain. EXAM: CT ABDOMEN AND PELVIS WITH CONTRAST TECHNIQUE: Multidetector CT imaging of the abdomen and pelvis was performed using the standard protocol following bolus administration of intravenous contrast. CONTRAST:  120mL OMNIPAQUE IOHEXOL 300 MG/ML  SOLN COMPARISON:  December 07, 2014. FINDINGS: Lower chest: No acute abnormality. Hepatobiliary: Status post cholecystectomy. Mild intrahepatic and extrahepatic biliary dilatation is noted most likely due to post cholecystectomy status. Liver is otherwise unremarkable. Pancreas: Unremarkable. No pancreatic ductal dilatation or surrounding inflammatory changes. Spleen: Multiple small calcified splenic granulomata are noted. No other abnormality seen involving the spleen. Adrenals/Urinary Tract: Adrenal glands appear normal. Bilateral renal cysts are noted. Mild left hydronephrosis is noted without obstructing calculus, concerning for some degree of ureteropelvic junction stenosis. No renal or ureteral calculi are noted. Mild urinary bladder distention is noted. Stomach/Bowel: The stomach appears normal. There is no evidence of bowel obstruction or inflammation. The appendix is not clearly visualized, but no inflammation is noted in the right lower quadrant. Stool is noted throughout the colon. Vascular/Lymphatic: Aortic atherosclerosis. No enlarged abdominal or pelvic lymph nodes. Reproductive: Status post hysterectomy. No  adnexal masses. Other: No abdominal wall hernia or abnormality. No abdominopelvic ascites. Musculoskeletal: Severe multilevel degenerative disc disease is noted in the lumbar spine. No acute osseous abnormality is noted. IMPRESSION: 1. Mild left hydronephrosis is noted without obstructing calculus, concerning for some degree of ureteropelvic junction stenosis. 2. Mild urinary bladder distention is noted. 3. No other significant abnormality seen in the abdomen or pelvis. Aortic Atherosclerosis (ICD10-I70.0). Electronically Signed   By: Marijo Conception M.D.   On: 01/08/2020 16:44       Subjective: Patient seen and examined at the bedside this morning.  Medically stable for discharge today.  Discharge Exam: Vitals:   01/09/20 2156 01/10/20 0655  BP: (!) 100/53 125/78  Pulse: 82 96  Resp: 15 16  Temp: 98.2 F (36.8 C) 98.4 F (36.9 C)  SpO2: 95% 98%   Vitals:   01/09/20 1415 01/09/20 1750 01/09/20 2156 01/10/20 0655  BP: (!) 104/50 (!) 103/51 (!) 100/53 125/78  Pulse: 82 88 82 96  Resp: 18 18 15 16   Temp: 97.8 F (36.6 C) 97.7 F (36.5 C) 98.2 F (36.8 C) 98.4 F (36.9 C)  TempSrc:   Oral Oral  SpO2: 97% 98% 95% 98%  Weight:      Height:        General: Pt is alert, awake, not in acute distress Cardiovascular: RRR, S1/S2 +, no rubs, no gallops Respiratory: CTA bilaterally, no wheezing, no rhonchi Abdominal: Soft, NT, ND, bowel sounds + Extremities: no edema, no cyanosis    The results of significant diagnostics from this hospitalization (including imaging, microbiology, ancillary and laboratory) are listed below for reference.     Microbiology: Recent  Results (from the past 240 hour(s))  Urine culture     Status: Abnormal   Collection Time: 01/08/20 10:15 AM   Specimen: In/Out Cath Urine  Result Value Ref Range Status   Specimen Description   Final    IN/OUT CATH URINE Performed at [facility], [place] [address] [place], [place] [address]     Special Requests   Final    NONE Performed at [facility], [place] [address] [place], [place] [address]    Culture >=100,000 COLONIES/mL PROTEUS [name] (A)  Final   Report Status 01/10/2020 FINAL  Final   Organism ID, Bacteria PROTEUS [name] (A)  Final      Susceptibility   Proteus penneri - MIC*    AMPICILLIN >=32 RESISTANT Resistant     CEFAZOLIN >=64 RESISTANT Resistant     CIPROFLOXACIN <=0.25 SENSITIVE Sensitive     GENTAMICIN <=1 SENSITIVE Sensitive     NITROFURANTOIN 128 RESISTANT Resistant     AMPICILLIN/SULBACTAM >=32 RESISTANT Resistant     PIP/TAZO <=4 SENSITIVE Sensitive     * >=100,000 COLONIES/mL PROTEUS [name]  Resp Panel by RT PCR (RSV, Flu A&B, Covid) - Nasopharyngeal Swab     Status: None   Collection Time: 01/08/20  7:34 PM   Specimen: Nasopharyngeal Swab  Result Value Ref Range Status   SARS Coronavirus 2 by RT PCR NEGATIVE NEGATIVE Final    Comment: (NOTE) SARS-CoV-2 target nucleic acids are NOT DETECTED.  The SARS-CoV-2 RNA is generally detectable in upper respiratoy specimens during the acute phase of infection. The lowest concentration of SARS-CoV-2 viral copies this assay can detect is 131 copies/mL. A negative result does not preclude SARS-Cov-2 infection and should not be used as the sole basis for treatment or other patient management decisions. A negative result may occur with  improper specimen collection/handling, submission of specimen other than nasopharyngeal swab, presence of viral mutation(s) within the areas targeted by this assay, and inadequate number of viral copies (<131 copies/mL). A negative result must be combined with clinical observations, patient history, and epidemiological information. The expected result is Negative.  Fact Sheet for Patients:  PinkCheek.be  Fact Sheet for Healthcare Providers:  GravelBags.it  This test is no t yet approved or  cleared by the [place] FDA and  has been authorized for detection and/or diagnosis of SARS-CoV-2 by FDA under an Emergency Use Authorization (EUA). This EUA will remain  in effect (meaning this test can be used) for the duration of the COVID-19 declaration under Section 564(b)(1) of the Act, 21 U.S.C. section 360bbb-3(b)(1), unless the authorization is terminated or revoked sooner.     Influenza A by PCR NEGATIVE NEGATIVE Final   Influenza B by PCR NEGATIVE NEGATIVE Final    Comment: (NOTE) The Xpert Xpress SARS-CoV-2/FLU/RSV assay is intended as an aid in  the diagnosis of influenza from Nasopharyngeal swab specimens and  should not be used as a sole basis for treatment. Nasal washings and  aspirates are unacceptable for Xpert Xpress SARS-CoV-2/FLU/RSV  testing.  Fact Sheet for Patients: PinkCheek.be  Fact Sheet for Healthcare Providers: GravelBags.it  This test is not yet approved or cleared by the [place] FDA and  has been authorized for detection and/or diagnosis of SARS-CoV-2 by  FDA under an Emergency Use Authorization (EUA). This EUA will remain  in effect (meaning this test can be used) for the duration of the  Covid-19 declaration under Section 564(b)(1) of the Act, 21  U.S.C. section 360bbb-3(b)(1), unless  the authorization is  terminated or revoked.    Respiratory Syncytial Virus by PCR NEGATIVE NEGATIVE Final    Comment: (NOTE) Fact Sheet for Patients: PinkCheek.be  Fact Sheet for Healthcare Providers: GravelBags.it  This test is not yet approved or cleared by the [place] FDA and  has been authorized for detection and/or diagnosis of SARS-CoV-2 by  FDA under an Emergency Use Authorization (EUA). This EUA will remain  in effect (meaning this test can be used) for the duration of the  COVID-19 declaration under Section 564(b)(1) of  the Act, 21 U.S.C.  section 360bbb-3(b)(1), unless the authorization is terminated or  revoked. Performed at [facility], [place] [address] [place], [place] [address]   Culture, blood (routine x 2)     Status: None (Preliminary result)   Collection Time: 01/08/20  7:34 PM   Specimen: BLOOD  Result Value Ref Range Status   Specimen Description   Final    BLOOD LEFT ANTECUBITAL Performed at [facility] [address], [place], [place] [address]    Special Requests   Final    BOTTLES DRAWN AEROBIC AND ANAEROBIC Blood Culture results may not be optimal due to an inadequate volume of blood received in culture bottles Performed at [facility] [address] [place], [place] [address]    Culture   Final    NO GROWTH 2 DAYS Performed at [facility] [address], [place], [place] [address]    Report Status PENDING  Incomplete  Culture, blood (routine x 2)     Status: None (Preliminary result)   Collection Time: 01/08/20  7:34 PM   Specimen: BLOOD  Result Value Ref Range Status   Specimen Description   Final    BLOOD RIGHT ANTECUBITAL Performed at [facility] [address] [place], [place] [address]    Special Requests   Final    BOTTLES DRAWN AEROBIC AND ANAEROBIC Blood Culture adequate volume Performed at [facility] [address], [place], [place] [address]    Culture   Final    NO GROWTH 2 DAYS Performed at [facility] [address], [place], [place] [address]    Report Status PENDING  Incomplete     Labs: BNP (last 3 results) No results for input(s): BNP in the last 8760 hours. Basic Metabolic Panel: Recent Labs  Lab 01/08/20 1050 01/09/20 0309 01/10/20 0312  NA 123* 127* 132*  K 4.6 3.9 3.9  CL 92* 96* 101  CO2 24 21* 23  GLUCOSE 124* 108* 113*  BUN 19 7* 8  CREATININE 0.60 0.38* 0.31*  CALCIUM 8.6* 8.3* 8.4*   Liver Function Tests: No  results for input(s): AST, ALT, ALKPHOS, BILITOT, PROT, ALBUMIN in the last 168 hours. No results for input(s): LIPASE, AMYLASE in the last 168 hours. No results for input(s): AMMONIA in the last 168 hours. CBC: Recent Labs  Lab 01/08/20 1050 01/09/20 0309  WBC 5.8 7.5  NEUTROABS 4.3  --   HGB 11.0* 11.8*  HCT 33.2* 36.3  MCV 99.4 100.0  PLT 266 258   Cardiac Enzymes: No results for input(s): CKTOTAL, CKMB, CKMBINDEX, TROPONINI in the last 168 hours. BNP: Invalid input(s): POCBNP CBG: Recent Labs  Lab 01/09/20 0834 01/09/20 1129 01/09/20 1848 01/09/20 2200 01/10/20 0739  GLUCAP 111* 354* 75 183* 151*   D-Dimer No results for input(s): DDIMER in the last 72 hours. Hgb A1c No results for  input(s): HGBA1C in the last 72 hours. Lipid Profile No results for input(s): CHOL, HDL, LDLCALC, TRIG, CHOLHDL, LDLDIRECT in the last 72 hours. Thyroid function studies Recent Labs    01/08/20 1934  TSH 1.563   Anemia work up No results for input(s): VITAMINB12, FOLATE, FERRITIN, TIBC, IRON, RETICCTPCT in the last 72 hours. Urinalysis    Component Value Date/Time   COLORURINE YELLOW 01/08/2020 1014   APPEARANCEUR TURBID (A) 01/08/2020 1014   LABSPEC 1.013 01/08/2020 1014   PHURINE 6.0 01/08/2020 1014   GLUCOSEU NEGATIVE 01/08/2020 1014   HGBUR SMALL (A) 01/08/2020 1014   BILIRUBINUR NEGATIVE 01/08/2020 1014   KETONESUR NEGATIVE 01/08/2020 1014   PROTEINUR 100 (A) 01/08/2020 1014   UROBILINOGEN 0.2 05/23/2008 0949   NITRITE NEGATIVE 01/08/2020 1014   LEUKOCYTESUR LARGE (A) 01/08/2020 1014   Sepsis Labs Invalid input(s): PROCALCITONIN,  WBC,  LACTICIDVEN Microbiology Recent Results (from the past 240 hour(s))  Urine culture     Status: Abnormal   Collection Time: 01/08/20 10:15 AM   Specimen: In/Out Cath Urine  Result Value Ref Range Status   Specimen Description   Final    IN/OUT CATH URINE Performed at [facility], [place] [address]  [place], [place] [address]    Special Requests   Final    NONE Performed at [facility], [place] [address] [place], [place] [address]    Culture >=100,000 COLONIES/mL PROTEUS [name] (A)  Final   Report Status 01/10/2020 FINAL  Final   Organism ID, Bacteria PROTEUS [name] (A)  Final      Susceptibility   Proteus penneri - MIC*    AMPICILLIN >=32 RESISTANT Resistant     CEFAZOLIN >=64 RESISTANT Resistant     CIPROFLOXACIN <=0.25 SENSITIVE Sensitive     GENTAMICIN <=1 SENSITIVE Sensitive     NITROFURANTOIN 128 RESISTANT Resistant     AMPICILLIN/SULBACTAM >=32 RESISTANT Resistant     PIP/TAZO <=4 SENSITIVE Sensitive     * >=100,000 COLONIES/mL PROTEUS [name]  Resp Panel by RT PCR (RSV, Flu A&B, Covid) - Nasopharyngeal Swab     Status: None   Collection Time: 01/08/20  7:34 PM   Specimen: Nasopharyngeal Swab  Result Value Ref Range Status   SARS Coronavirus 2 by RT PCR NEGATIVE NEGATIVE Final    Comment: (NOTE) SARS-CoV-2 target nucleic acids are NOT DETECTED.  The SARS-CoV-2 RNA is generally detectable in upper respiratoy specimens during the acute phase of infection. The lowest concentration of SARS-CoV-2 viral copies this assay can detect is 131 copies/mL. A negative result does not preclude SARS-Cov-2 infection and should not be used as the sole basis for treatment or other patient management decisions. A negative result may occur with  improper specimen collection/handling, submission of specimen other than nasopharyngeal swab, presence of viral mutation(s) within the areas targeted by this assay, and inadequate number of viral copies (<131 copies/mL). A negative result must be combined with clinical observations, patient history, and epidemiological information. The expected result is Negative.  Fact Sheet for Patients:  PinkCheek.be  Fact Sheet for Healthcare Providers:  GravelBags.it  This test is  no t yet approved or cleared by the [place] FDA and  has been authorized for detection and/or diagnosis of SARS-CoV-2 by FDA under an Emergency Use Authorization (EUA). This EUA will remain  in effect (meaning this test can be used) for the duration of the COVID-19 declaration under Section 564(b)(1) of the Act, 21 U.S.C. section 360bbb-3(b)(1), unless the authorization is terminated  or revoked sooner.     Influenza A by PCR NEGATIVE NEGATIVE Final   Influenza B by PCR NEGATIVE NEGATIVE Final    Comment: (NOTE) The Xpert Xpress SARS-CoV-2/FLU/RSV assay is intended as an aid in  the diagnosis of influenza from Nasopharyngeal swab specimens and  should not be used as a sole basis for treatment. Nasal washings and  aspirates are unacceptable for Xpert Xpress SARS-CoV-2/FLU/RSV  testing.  Fact Sheet for Patients: PinkCheek.be  Fact Sheet for Healthcare Providers: GravelBags.it  This test is not yet approved or cleared by the [place] FDA and  has been authorized for detection and/or diagnosis of SARS-CoV-2 by  FDA under an Emergency Use Authorization (EUA). This EUA will remain  in effect (meaning this test can be used) for the duration of the  Covid-19 declaration under Section 564(b)(1) of the Act, 21  U.S.C. section 360bbb-3(b)(1), unless the authorization is  terminated or revoked.    Respiratory Syncytial Virus by PCR NEGATIVE NEGATIVE Final    Comment: (NOTE) Fact Sheet for Patients: PinkCheek.be  Fact Sheet for Healthcare Providers: GravelBags.it  This test is not yet approved or cleared by the [place] FDA and  has been authorized for detection and/or diagnosis of SARS-CoV-2 by  FDA under an Emergency Use Authorization (EUA). This EUA will remain  in effect (meaning this test can be used) for the duration of the  COVID-19 declaration under  Section 564(b)(1) of the Act, 21 U.S.C.  section 360bbb-3(b)(1), unless the authorization is terminated or  revoked. Performed at [facility], [place] [address] [place], [place] [address]   Culture, blood (routine x 2)     Status: None (Preliminary result)   Collection Time: 01/08/20  7:34 PM   Specimen: BLOOD  Result Value Ref Range Status   Specimen Description   Final    BLOOD LEFT ANTECUBITAL Performed at [facility] [address] [place], [place] [address]    Special Requests   Final    BOTTLES DRAWN AEROBIC AND ANAEROBIC Blood Culture results may not be optimal due to an inadequate volume of blood received in culture bottles Performed at [facility] [address], [place], [place] [address]    Culture   Final    NO GROWTH 2 DAYS Performed at [facility] [address], [place], [place] [address]    Report Status PENDING  Incomplete  Culture, blood (routine x 2)     Status: None (Preliminary result)   Collection Time: 01/08/20  7:34 PM   Specimen: BLOOD  Result Value Ref Range Status   Specimen Description   Final    BLOOD RIGHT ANTECUBITAL Performed at [facility] [address] [place], [place] [address]    Special Requests   Final    BOTTLES DRAWN AEROBIC AND ANAEROBIC Blood Culture adequate volume Performed at [facility] [address], [place], [place] [address]    Culture   Final    NO GROWTH 2 DAYS Performed at [facility] [address], [place], [place] [address]    Report Status PENDING  Incomplete    Please note: You were cared for by a hospitalist during your hospital stay. Once you are discharged, your primary care physician will handle any further medical issues. Please note that NO REFILLS for any discharge medications will be authorized once you are discharged, as it is imperative that you return to your primary care  physician (or  establish a relationship with a primary care physician if you do not have one) for your post hospital discharge needs so that they can reassess your need for medications and monitor your lab values.    Time coordinating discharge: 40 minutes  SIGNED:   Shelly Coss, MD  [facility] 01/10/2020, 10:50 AM Pager [phone]  If 7PM-7AM, please contact night-coverage [email or web address] Password [email or web address]

## 2020-01-13 LAB — CULTURE, BLOOD (ROUTINE X 2)
Culture: NO GROWTH
Culture: NO GROWTH
Special Requests: ADEQUATE

## 2020-01-20 DIAGNOSIS — N1330 Unspecified hydronephrosis: Secondary | ICD-10-CM | POA: Diagnosis not present

## 2020-01-20 DIAGNOSIS — E871 Hypo-osmolality and hyponatremia: Secondary | ICD-10-CM | POA: Diagnosis not present

## 2020-01-20 DIAGNOSIS — Z87440 Personal history of urinary (tract) infections: Secondary | ICD-10-CM | POA: Diagnosis not present

## 2020-01-20 DIAGNOSIS — E11319 Type 2 diabetes mellitus with unspecified diabetic retinopathy without macular edema: Secondary | ICD-10-CM | POA: Diagnosis not present

## 2020-02-07 DIAGNOSIS — R300 Dysuria: Secondary | ICD-10-CM | POA: Diagnosis not present

## 2020-02-07 DIAGNOSIS — N3000 Acute cystitis without hematuria: Secondary | ICD-10-CM | POA: Diagnosis not present

## 2020-02-07 DIAGNOSIS — N130 Hydronephrosis with ureteropelvic junction obstruction: Secondary | ICD-10-CM | POA: Diagnosis not present

## 2020-02-22 ENCOUNTER — Emergency Department (HOSPITAL_COMMUNITY): Payer: Medicare Other

## 2020-02-22 ENCOUNTER — Emergency Department (HOSPITAL_COMMUNITY)
Admission: EM | Admit: 2020-02-22 | Discharge: 2020-03-21 | Disposition: E | Payer: Medicare Other | Attending: Emergency Medicine | Admitting: Emergency Medicine

## 2020-02-22 ENCOUNTER — Encounter (HOSPITAL_COMMUNITY): Payer: Self-pay | Admitting: Emergency Medicine

## 2020-02-22 DIAGNOSIS — K922 Gastrointestinal hemorrhage, unspecified: Secondary | ICD-10-CM | POA: Diagnosis not present

## 2020-02-22 DIAGNOSIS — J9601 Acute respiratory failure with hypoxia: Secondary | ICD-10-CM | POA: Diagnosis not present

## 2020-02-22 DIAGNOSIS — I9589 Other hypotension: Secondary | ICD-10-CM

## 2020-02-22 DIAGNOSIS — S199XXA Unspecified injury of neck, initial encounter: Secondary | ICD-10-CM | POA: Diagnosis not present

## 2020-02-22 DIAGNOSIS — R52 Pain, unspecified: Secondary | ICD-10-CM | POA: Diagnosis not present

## 2020-02-22 DIAGNOSIS — S72102A Unspecified trochanteric fracture of left femur, initial encounter for closed fracture: Secondary | ICD-10-CM | POA: Diagnosis not present

## 2020-02-22 DIAGNOSIS — E860 Dehydration: Secondary | ICD-10-CM | POA: Diagnosis not present

## 2020-02-22 DIAGNOSIS — R109 Unspecified abdominal pain: Secondary | ICD-10-CM | POA: Insufficient documentation

## 2020-02-22 DIAGNOSIS — K559 Vascular disorder of intestine, unspecified: Secondary | ICD-10-CM | POA: Diagnosis not present

## 2020-02-22 DIAGNOSIS — Z7189 Other specified counseling: Secondary | ICD-10-CM | POA: Diagnosis not present

## 2020-02-22 DIAGNOSIS — Z8542 Personal history of malignant neoplasm of other parts of uterus: Secondary | ICD-10-CM | POA: Insufficient documentation

## 2020-02-22 DIAGNOSIS — R7989 Other specified abnormal findings of blood chemistry: Secondary | ICD-10-CM

## 2020-02-22 DIAGNOSIS — T796XXA Traumatic ischemia of muscle, initial encounter: Secondary | ICD-10-CM

## 2020-02-22 DIAGNOSIS — J181 Lobar pneumonia, unspecified organism: Secondary | ICD-10-CM | POA: Diagnosis not present

## 2020-02-22 DIAGNOSIS — N19 Unspecified kidney failure: Secondary | ICD-10-CM

## 2020-02-22 DIAGNOSIS — N179 Acute kidney failure, unspecified: Secondary | ICD-10-CM | POA: Diagnosis not present

## 2020-02-22 DIAGNOSIS — K6389 Other specified diseases of intestine: Secondary | ICD-10-CM | POA: Diagnosis not present

## 2020-02-22 DIAGNOSIS — N3289 Other specified disorders of bladder: Secondary | ICD-10-CM | POA: Diagnosis not present

## 2020-02-22 DIAGNOSIS — E861 Hypovolemia: Secondary | ICD-10-CM | POA: Diagnosis not present

## 2020-02-22 DIAGNOSIS — R531 Weakness: Secondary | ICD-10-CM | POA: Insufficient documentation

## 2020-02-22 DIAGNOSIS — E1165 Type 2 diabetes mellitus with hyperglycemia: Secondary | ICD-10-CM | POA: Diagnosis not present

## 2020-02-22 DIAGNOSIS — I10 Essential (primary) hypertension: Secondary | ICD-10-CM | POA: Insufficient documentation

## 2020-02-22 DIAGNOSIS — M47812 Spondylosis without myelopathy or radiculopathy, cervical region: Secondary | ICD-10-CM | POA: Diagnosis not present

## 2020-02-22 DIAGNOSIS — R0689 Other abnormalities of breathing: Secondary | ICD-10-CM | POA: Diagnosis not present

## 2020-02-22 DIAGNOSIS — F0390 Unspecified dementia without behavioral disturbance: Secondary | ICD-10-CM | POA: Insufficient documentation

## 2020-02-22 DIAGNOSIS — Z7984 Long term (current) use of oral hypoglycemic drugs: Secondary | ICD-10-CM | POA: Insufficient documentation

## 2020-02-22 DIAGNOSIS — Z4682 Encounter for fitting and adjustment of non-vascular catheter: Secondary | ICD-10-CM | POA: Diagnosis not present

## 2020-02-22 DIAGNOSIS — S0990XA Unspecified injury of head, initial encounter: Secondary | ICD-10-CM | POA: Diagnosis not present

## 2020-02-22 DIAGNOSIS — E1140 Type 2 diabetes mellitus with diabetic neuropathy, unspecified: Secondary | ICD-10-CM | POA: Insufficient documentation

## 2020-02-22 DIAGNOSIS — R404 Transient alteration of awareness: Secondary | ICD-10-CM | POA: Diagnosis not present

## 2020-02-22 DIAGNOSIS — K2980 Duodenitis without bleeding: Secondary | ICD-10-CM

## 2020-02-22 DIAGNOSIS — E872 Acidosis, unspecified: Secondary | ICD-10-CM

## 2020-02-22 DIAGNOSIS — S2243XA Multiple fractures of ribs, bilateral, initial encounter for closed fracture: Secondary | ICD-10-CM | POA: Diagnosis not present

## 2020-02-22 DIAGNOSIS — Z20822 Contact with and (suspected) exposure to covid-19: Secondary | ICD-10-CM | POA: Diagnosis not present

## 2020-02-22 DIAGNOSIS — K3189 Other diseases of stomach and duodenum: Secondary | ICD-10-CM | POA: Diagnosis not present

## 2020-02-22 DIAGNOSIS — I469 Cardiac arrest, cause unspecified: Secondary | ICD-10-CM | POA: Diagnosis not present

## 2020-02-22 DIAGNOSIS — Z789 Other specified health status: Secondary | ICD-10-CM

## 2020-02-22 DIAGNOSIS — I959 Hypotension, unspecified: Secondary | ICD-10-CM | POA: Diagnosis not present

## 2020-02-22 DIAGNOSIS — Z79899 Other long term (current) drug therapy: Secondary | ICD-10-CM | POA: Insufficient documentation

## 2020-02-22 DIAGNOSIS — A419 Sepsis, unspecified organism: Secondary | ICD-10-CM | POA: Diagnosis not present

## 2020-02-22 DIAGNOSIS — Z515 Encounter for palliative care: Secondary | ICD-10-CM | POA: Diagnosis not present

## 2020-02-22 LAB — I-STAT CHEM 8, ED
BUN: >130 mg/dL — ABNORMAL HIGH (ref 8–23)
Calcium, Ion: 0.99 mmol/L — ABNORMAL LOW (ref 1.15–1.40)
Chloride: 109 mmol/L (ref 98–111)
Creatinine, Ser: 3.5 mg/dL — ABNORMAL HIGH (ref 0.44–1.00)
Glucose, Bld: 315 mg/dL — ABNORMAL HIGH (ref 70–99)
HCT: 28 % — ABNORMAL LOW (ref 36.0–46.0)
Hemoglobin: 9.5 g/dL — ABNORMAL LOW (ref 12.0–15.0)
Potassium: 4.9 mmol/L (ref 3.5–5.1)
Sodium: 139 mmol/L (ref 135–145)
TCO2: 15 mmol/L — ABNORMAL LOW (ref 22–32)

## 2020-02-22 LAB — COMPREHENSIVE METABOLIC PANEL
ALT: 102 U/L — ABNORMAL HIGH (ref 0–44)
AST: 140 U/L — ABNORMAL HIGH (ref 15–41)
Albumin: 2.7 g/dL — ABNORMAL LOW (ref 3.5–5.0)
Alkaline Phosphatase: 148 U/L — ABNORMAL HIGH (ref 38–126)
Anion gap: 24 — ABNORMAL HIGH (ref 5–15)
BUN: 156 mg/dL — ABNORMAL HIGH (ref 8–23)
CO2: 13 mmol/L — ABNORMAL LOW (ref 22–32)
Calcium: 8.6 mg/dL — ABNORMAL LOW (ref 8.9–10.3)
Chloride: 104 mmol/L (ref 98–111)
Creatinine, Ser: 3.74 mg/dL — ABNORMAL HIGH (ref 0.44–1.00)
GFR, Estimated: 11 mL/min — ABNORMAL LOW (ref >60)
Glucose, Bld: 349 mg/dL — ABNORMAL HIGH (ref 70–99)
Potassium: 5.3 mmol/L — ABNORMAL HIGH (ref 3.5–5.1)
Sodium: 141 mmol/L (ref 135–145)
Total Bilirubin: 1.1 mg/dL (ref 0.3–1.2)
Total Protein: 5.9 g/dL — ABNORMAL LOW (ref 6.5–8.1)

## 2020-02-22 LAB — CBC
HCT: 21.3 % — ABNORMAL LOW (ref 36.0–46.0)
Hemoglobin: 6.7 g/dL — CL (ref 12.0–15.0)
MCH: 33 pg (ref 26.0–34.0)
MCHC: 31.5 g/dL (ref 30.0–36.0)
MCV: 104.9 fL — ABNORMAL HIGH (ref 80.0–100.0)
Platelets: 161 10*3/uL (ref 150–400)
RBC: 2.03 MIL/uL — ABNORMAL LOW (ref 3.87–5.11)
RDW: 13.6 % (ref 11.5–15.5)
WBC: 8.2 10*3/uL (ref 4.0–10.5)
nRBC: 0 % (ref 0.0–0.2)

## 2020-02-22 LAB — POC OCCULT BLOOD, ED: Fecal Occult Bld: POSITIVE — AB

## 2020-02-22 LAB — CK: Total CK: 2524 U/L — ABNORMAL HIGH (ref 38–234)

## 2020-02-22 LAB — RESP PANEL BY RT-PCR (FLU A&B, COVID) ARPGX2
Influenza A by PCR: NEGATIVE
Influenza B by PCR: NEGATIVE
SARS Coronavirus 2 by RT PCR: NEGATIVE

## 2020-02-22 LAB — PREPARE RBC (CROSSMATCH)

## 2020-02-22 LAB — LACTIC ACID, PLASMA
Lactic Acid, Venous: 4.3 mmol/L (ref 0.5–1.9)
Lactic Acid, Venous: 9 mmol/L (ref 0.5–1.9)

## 2020-02-22 MED ORDER — VANCOMYCIN HCL IN DEXTROSE 1-5 GM/200ML-% IV SOLN
1000.0000 mg | Freq: Once | INTRAVENOUS | Status: AC
  Administered 2020-02-22: 1000 mg via INTRAVENOUS

## 2020-02-22 MED ORDER — EPINEPHRINE 1 MG/10ML IJ SOSY
PREFILLED_SYRINGE | INTRAMUSCULAR | Status: AC | PRN
  Administered 2020-02-22 (×2): 1 via INTRAVENOUS

## 2020-02-22 MED ORDER — SODIUM CHLORIDE 0.9 % IV SOLN
80.0000 mg | Freq: Once | INTRAVENOUS | Status: DC
  Filled 2020-02-22: qty 80

## 2020-02-22 MED ORDER — ROCURONIUM BROMIDE 50 MG/5ML IV SOLN
INTRAVENOUS | Status: AC | PRN
  Administered 2020-02-22: 100 mg via INTRAVENOUS

## 2020-02-22 MED ORDER — SODIUM CHLORIDE 0.9 % IV SOLN: 10.0000 mL/h | Freq: Once | INTRAVENOUS | Status: DC

## 2020-02-22 MED ORDER — SODIUM CHLORIDE 0.9 % IV BOLUS
1000.0000 mL | Freq: Once | INTRAVENOUS | Status: AC
  Administered 2020-02-22: 1000 mL via INTRAVENOUS

## 2020-02-22 MED ORDER — EPINEPHRINE 1 MG/10ML IJ SOSY
PREFILLED_SYRINGE | INTRAMUSCULAR | Status: AC | PRN
  Administered 2020-02-22: 1 via INTRAVENOUS

## 2020-02-22 MED ORDER — SODIUM CHLORIDE 0.9 % IV SOLN: 1.0000 g | INTRAVENOUS | Status: DC

## 2020-02-22 MED ORDER — VANCOMYCIN HCL IN DEXTROSE 1-5 GM/200ML-% IV SOLN
1000.0000 mg | Freq: Once | INTRAVENOUS | Status: DC
  Filled 2020-02-22: qty 200

## 2020-02-22 MED ORDER — ETOMIDATE 2 MG/ML IV SOLN
INTRAVENOUS | Status: AC | PRN
  Administered 2020-02-22: 20 mg via INTRAVENOUS

## 2020-02-22 MED ORDER — MORPHINE SULFATE (PF) 4 MG/ML IV SOLN
10.0000 mg | INTRAVENOUS | Status: DC | PRN
  Administered 2020-02-22: 10 mg via INTRAVENOUS
  Filled 2020-02-22: qty 3

## 2020-02-22 MED ORDER — LACTATED RINGERS IV BOLUS (SEPSIS)
500.0000 mL | Freq: Once | INTRAVENOUS | Status: AC
  Administered 2020-02-22: 500 mL via INTRAVENOUS

## 2020-02-22 MED ORDER — LACTATED RINGERS IV BOLUS (SEPSIS)
1000.0000 mL | Freq: Once | INTRAVENOUS | Status: AC
  Administered 2020-02-22: 1000 mL via INTRAVENOUS

## 2020-02-22 MED ORDER — EPINEPHRINE HCL 5 MG/250ML IV SOLN IN NS
0.5000 ug/min | INTRAVENOUS | Status: DC
  Administered 2020-02-22: 5 ug/min via INTRAVENOUS

## 2020-02-22 MED ORDER — LACTATED RINGERS IV BOLUS (SEPSIS)
250.0000 mL | Freq: Once | INTRAVENOUS | Status: AC
  Administered 2020-02-22: 250 mL via INTRAVENOUS

## 2020-02-22 MED ORDER — SODIUM CHLORIDE 0.9 % IV SOLN
2.0000 g | Freq: Once | INTRAVENOUS | Status: AC
  Administered 2020-02-22: 2 g via INTRAVENOUS
  Filled 2020-02-22: qty 2

## 2020-02-22 MED ORDER — VANCOMYCIN VARIABLE DOSE PER UNSTABLE RENAL FUNCTION (PHARMACIST DOSING): Status: DC

## 2020-02-22 MED ORDER — MORPHINE SULFATE (PF) 10 MG/ML IV SOLN: 10.0000 mg | INTRAVENOUS | Status: DC | PRN
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
## 2020-02-22 NOTE — ED Triage Notes (Signed)
Pt bib ems from home. LSW Monday by family. Faamily found pt today down at home. Code sepsis called. Pt was recently treated for UTI.  Pt given 531mL NS en route for low BP in 60s  Hx of dementia, HTN, diabetes  Vitals: HR 95 BP 96/56 RR 24 Temp 95.6 CBG 521

## 2020-02-22 NOTE — H&P (Signed)
NAME:  [name], MRN:  [id number], DOB:  [birth year], LOS: 0 ADMISSION DATE:  03/17/2020, CONSULTATION DATE: 03/01/2020 REFERRING MD: Zacarias Pontes, ED, CHIEF COMPLAINT: Sepsis  Brief History   Patient is an 86 year old female with history of dementia found down for unknown period of time brought to emergency room patient arrested and in the ER with loss of pulses rhythm unclear 1 round of CPR epi currently hypotensive on normal saline  History of present illness   Patient is an 86 year old female with history of dementia found down for unknown period of time apparently after a fall at home brought to emergency room patient arrested and in the ER with loss of pulses rhythm unclear 1 round of CPR epi currently hypotensive on normal saline. CT of the abdomen shows bibasilar consolidation consistent with pneumonia distended stomach with air-fluid level possible duodenitis, probable obstipation, left-sided rib fractures involving the fifth through ninth ribs, comminuted fracture of the left proximal femur.  CT scan of the brain and C-spine was unremarkable.  Chest x-ray shows bilateral increased interstitial markings, ET tube in good position.  Labs are pertinent for creatinine of 3.5 glucose of 315 hemoglobin6.7, repeat was done and is 9.5 will need an explanation of this.  BUN is greater than 130 potassium 4.9.  Patient is Covid negative.  UA shows osmolality of 372 with urine sodium 63 nitrites are negative large white blood cells.  Stools positive for occult blood.  CT scan most consistent with ischemic bowel possible duodenal possible colon.  Past Medical History   . Cancer ([place]) 1980's   endometrial, hystectomy done  . Diabetes mellitus without complication ([place])   . Hypertension   . Peripheral neuropathy    feet     Significant Hospital Events   As above  Consults:  PCCM  Procedures:  Intubation  Significant Diagnostic Tests:  As above  Micro Data:  Blood cultures and  urine culture planted  Antimicrobials:  Empiric cefepime and vancomycin started  Interim history/subjective:  NA  Objective   Blood pressure (!) 81/40, pulse (!) 123, temperature (!) 93.8 F (34.3 C), temperature source Rectal, resp. rate 16, height 5\' 3"  (1.6 m), weight 49 kg, SpO2 100 %.        Intake/Output Summary (Last 24 hours) at 02/20/2020 2044 Last data filed at 02/21/2020 2025 Gross per 24 hour  Intake 3551.53 ml  Output --  Net 3551.53 ml   Filed Weights   03/04/2020 1700  Weight: 49 kg    Examination: General: Thin female ventilated not responsive HENT: Within normal limits Lungs: Coarse bilaterally Cardiovascular: Regular rate and rhythm Abdomen: Benign Extremities: Within normal limits Neuro: Light neurologic exam not responsive to pain GU: N/A  Resolved Hospital Problem list   NA  Assessment & Plan:  1.  Sepsis most likely ammonia as source.  Will continue mechanical ventilation and current antibiotics.  2.  Acute kidney injury: Secondary to dehydration and sepsis.  Prolonged period of time down.  3.  Status post fall at home with normal CT scan of the brain, rib fractures, femur fracture.  Little to offer in regards to therapy for these currently.  Should she survive, which is extremely unlikely, will need to be addressed at some point in the future.  4.  Acute hypoxemic respiratory failure status post cardiac arrest: Continue mechanical ventilation  5.  Hypotension secondary to anemia and sepsis: Continue volume resuscitation.  Will discuss with family aggressiveness of care in regards to pre ssors.  6.  Dismal prognosis based on multitrauma, prior history of dementia, prolonged period of time down with dehydration sepsis, advanced age, profound hypotension secondary to sepsis, septic shock.  After discussion with the family, who is representing themselves as her next of kin, 2 nieces, and explaining the unrecoverable nature of the patient's situation  which includes traumatic hip fracture, rib fractures, ischemic bowel, GI bleeding, severe acute kidney injury, that is post 3 resuscitative attempts, probable anoxic brain injury, bilateral pneumonia and septic shock they have decided to withdraw care to include vasopressors and mechanical ventilation.  They request sedation prior to withdrawal.  Will follow their wishes.  Best practice (evaluated daily)   Diet: N.p.o. Pain/Anxiety/Delirium protocol (if indicated): Fentanyl for pain sedation while ventilated VAP protocol (if indicated): Yes DVT prophylaxis: Yes GI prophylaxis: Yes Glucose control: Monitor with sliding scale insulin Mobility: Bedrest Code Status: Full Disposition: To ICU for further therapy  Labs   CBC: Recent Labs  Lab 02/21/2020 1555 03/15/2020 1606  WBC 8.2  --   HGB 6.7* 9.5*  HCT 21.3* 28.0*  MCV 104.9*  --   PLT 161  --     Basic Metabolic Panel: Recent Labs  Lab 03/05/2020 1555 03/12/2020 1606  NA 141 139  K 5.3* 4.9  CL 104 109  CO2 13*  --   GLUCOSE 349* 315*  BUN 156* >130*  CREATININE 3.74* 3.50*  CALCIUM 8.6*  --    GFR: Estimated Creatinine Clearance: 9.3 mL/min (A) (by C-G formula based on SCr of 3.5 mg/dL (H)). Recent Labs  Lab 02/24/2020 1555  WBC 8.2  LATICACIDVEN 4.3*    Liver Function Tests: Recent Labs  Lab 02/27/2020 1555  AST 140*  ALT 102*  ALKPHOS 148*  BILITOT 1.1  PROT 5.9*  ALBUMIN 2.7*   No results for input(s): LIPASE, AMYLASE in the last 168 hours. No results for input(s): AMMONIA in the last 168 hours.  ABG    Component Value Date/Time   TCO2 15 (L) 03/02/2020 1606     Coagulation Profile: No results for input(s): INR, PROTIME in the last 168 hours.  Cardiac Enzymes: Recent Labs  Lab 03/02/2020 1555  CKTOTAL 2,524*    HbA1C: No results found for: HGBA1C  CBG: No results for input(s): GLUCAP in the last 168 hours.  Review of Systems:   Unable to obtain.  Patient is post CPR and mechanically  ventilated  Past Medical History  She,  has a past medical history of Cancer ([place]) (1980's), Diabetes mellitus without complication ([place]), Hypertension, and Peripheral neuropathy.   Surgical History    Past Surgical History:  Procedure Laterality Date  . ABDOMINAL HYSTERECTOMY    . BACK SURGERY  4 years ago   lower back  . breast cyst removed Bilateral   . BREAST EXCISIONAL BIOPSY Right 1960  . BREAST EXCISIONAL BIOPSY Bilateral   . CHOLECYSTECTOMY    . COLONOSCOPY WITH PROPOFOL N/A 03/02/2015   Procedure: COLONOSCOPY WITH PROPOFOL;  Surgeon: Garlan Fair, MD;  Location: [facility] ENDOSCOPY;  Service: Endoscopy;  Laterality: N/A;  . cyst removed from kidney  1970     Social History   reports that she has never smoked. She has never used smokeless tobacco. She reports that she does not drink alcohol and does not use drugs.   Family History   Her family history is not on file.   Allergies Allergies  Allergen Reactions  . Codeine     dizziness     Home Medications  Prior to Admission medications   Medication Sig Start Date End Date Taking? Authorizing Provider  acetaminophen (TYLENOL) 500 MG tablet Take 1,000 mg by mouth every 8 (eight) hours as needed for mild pain.    [provider]  latanoprost (XALATAN) 0.005 % ophthalmic solution Place 1 drop into both eyes at bedtime.  11/05/14   [provider]  metFORMIN (GLUCOPHAGE) 500 MG tablet  02/10/19   [provider]  Multiple Vitamins-Minerals (PRESERVISION AREDS 2+MULTI VIT PO) Take 1 tablet by mouth in the morning and at bedtime.    [provider]  phenazopyridine (PYRIDIUM) 100 MG tablet Take 1 tablet (100 mg total) by mouth 3 (three) times daily as needed for pain (lower abd pain/dysuria). 01/10/20   Shelly Coss, MD  quinapril (ACCUPRIL) 5 MG tablet Take 5 mg by mouth daily. 12/09/19   [provider]     Critical care time: Over 35 minutes spent bedside evaluation chart  review and critical care planning.

## 2020-02-22 NOTE — ED Provider Notes (Signed)
[facility] Provider Note   CSN: [id number] Arrival date & time: 03/08/2020  1527     History Chief Complaint  Patient presents with  . Weakness    [name] is a 86 y.o. female.  Patient w hx dementia found on floor of her residence by family today. Patient was last heard from 5 days ago. Was found on floor, urine on clothing. Pt very limited historian, dementia, level 5 caveat. No report of fevers. Pt unable to indicate/identify specific pain. Unable to indicate what caused fall.   The history is provided by the patient and the EMS personnel. The history is limited by the condition of the patient.       Past Medical History:  Diagnosis Date  . Cancer ([place]) 1980's   endometrial, hystectomy done  . Diabetes mellitus without complication ([employer])   . Hypertension   . Peripheral neuropathy    feet    Patient Active Problem List   Diagnosis Date Noted  . UTI (urinary tract infection) 01/09/2020  . Diabetes mellitus ([place]) 01/08/2020  . AMS (altered mental status) 01/08/2020  . HTN (hypertension) 01/08/2020  . Acute lower UTI 01/08/2020  . Hyponatremia 01/08/2020    Past Surgical History:  Procedure Laterality Date  . ABDOMINAL HYSTERECTOMY    . BACK SURGERY  4 years ago   lower back  . breast cyst removed Bilateral   . BREAST EXCISIONAL BIOPSY Right 1960  . BREAST EXCISIONAL BIOPSY Bilateral   . CHOLECYSTECTOMY    . COLONOSCOPY WITH PROPOFOL N/A 03/02/2015   Procedure: COLONOSCOPY WITH PROPOFOL;  Surgeon: Garlan Fair, MD;  Location: [facility];  Service: Endoscopy;  Laterality: N/A;  . cyst removed from kidney  1970     OB History   No obstetric history on file.     History reviewed. No pertinent family history.  Social History   Tobacco Use  . Smoking status: Never Smoker  . Smokeless tobacco: Never Used  Vaping Use  . Vaping Use: Never used  Substance Use Topics  . Alcohol use: No  . Drug use: No     Home Medications Prior to Admission medications   Medication Sig Start Date End Date Taking? Authorizing Provider  acetaminophen (TYLENOL) 500 MG tablet Take 1,000 mg by mouth every 8 (eight) hours as needed for mild pain.    [provider]  latanoprost (XALATAN) 0.005 % ophthalmic solution Place 1 drop into both eyes at bedtime.  11/05/14   [provider]  metFORMIN (GLUCOPHAGE) 500 MG tablet  02/10/19   [provider]  Multiple Vitamins-Minerals (PRESERVISION AREDS 2+MULTI VIT PO) Take 1 tablet by mouth in the morning and at bedtime.    [provider]  phenazopyridine (PYRIDIUM) 100 MG tablet Take 1 tablet (100 mg total) by mouth 3 (three) times daily as needed for pain (lower abd pain/dysuria). 01/10/20   Shelly Coss, MD  quinapril (ACCUPRIL) 5 MG tablet Take 5 mg by mouth daily. 12/09/19   [provider]    Allergies    Codeine  Review of Systems   Review of Systems  Unable to perform ROS: Dementia  level 5 caveat, dementia, not verbally responsive    Physical Exam Updated Vital Signs BP (!) 112/57   Pulse 76   Temp (!) 93.8 F (34.3 C) (Rectal)   Resp (!) 33   SpO2 91%   Physical Exam Vitals and nursing note reviewed.  Constitutional:  Appearance: Normal appearance. She is well-developed.  HENT:     Head: Atraumatic.     Nose: Nose normal.     Mouth/Throat:     Mouth: Mucous membranes are moist.  Eyes:     General: No scleral icterus.    Conjunctiva/sclera: Conjunctivae normal.     Pupils: Pupils are equal, round, and reactive to light.  Neck:     Vascular: No carotid bruit.     Trachea: No tracheal deviation.     Comments: No stiffness or rigidity.  Cardiovascular:     Rate and Rhythm: Normal rate and regular rhythm.     Pulses: Normal pulses.     Heart sounds: Normal heart sounds. No murmur heard.  No friction rub. No gallop.   Pulmonary:     Effort: Pulmonary effort is normal. No respiratory  distress.     Breath sounds: Normal breath sounds.  Abdominal:     General: Bowel sounds are normal. There is no distension.     Palpations: Abdomen is soft.     Tenderness: There is abdominal tenderness. There is no guarding.     Comments: Mid/diffuse abd tenderness.   Genitourinary:    Comments: No cva tenderness.  Musculoskeletal:        General: No swelling.     Cervical back: Normal range of motion and neck supple. No rigidity. No muscular tenderness.     Comments: CTLS spine, non tender, aligned, no step off. No focal bony tenderness on extremity exam.   Skin:    General: Skin is warm and dry.     Findings: No rash.  Neurological:     Mental Status: She is alert.     Comments: Awake and alert. Moves bil ext purposefully.   Psychiatric:        Mood and Affect: Mood normal.     ED Results / Procedures / Treatments   Labs (all labs ordered are listed, but only abnormal results are displayed) Results for orders placed or performed during the hospital encounter of 02/29/2020  Resp Panel by RT-PCR (Flu A&B, Covid) Nasopharyngeal Swab   Specimen: Nasopharyngeal Swab; Nasopharyngeal(NP) swabs in vial transport medium  Result Value Ref Range   SARS Coronavirus 2 by RT PCR NEGATIVE NEGATIVE   Influenza A by PCR NEGATIVE NEGATIVE   Influenza B by PCR NEGATIVE NEGATIVE  Lactic acid, plasma  Result Value Ref Range   Lactic Acid, Venous 4.3 (HH) 0.5 - 1.9 mmol/L  CBC  Result Value Ref Range   WBC 8.2 4.0 - 10.5 K/uL   RBC 2.03 (L) 3.87 - 5.11 MIL/uL   Hemoglobin 6.7 (LL) 12.0 - 15.0 g/dL   HCT 21.3 (L) 36 - 46 %   MCV 104.9 (H) 80.0 - 100.0 fL   MCH 33.0 26.0 - 34.0 pg   MCHC 31.5 30.0 - 36.0 g/dL   RDW 13.6 11.5 - 15.5 %   Platelets 161 150 - 400 K/uL   nRBC 0.0 0.0 - 0.2 %  Comprehensive metabolic panel  Result Value Ref Range   Sodium 141 135 - 145 mmol/L   Potassium 5.3 (H) 3.5 - 5.1 mmol/L   Chloride 104 98 - 111 mmol/L   CO2 13 (L) 22 - 32 mmol/L   Glucose, Bld 349  (H) 70 - 99 mg/dL   BUN 156 (H) 8 - 23 mg/dL   Creatinine, Ser 3.74 (H) 0.44 - 1.00 mg/dL   Calcium 8.6 (L) 8.9 - 10.3 mg/dL   Total  Protein 5.9 (L) 6.5 - 8.1 g/dL   Albumin 2.7 (L) 3.5 - 5.0 g/dL   AST 140 (H) 15 - 41 U/L   ALT 102 (H) 0 - 44 U/L   Alkaline Phosphatase 148 (H) 38 - 126 U/L   Total Bilirubin 1.1 0.3 - 1.2 mg/dL   GFR, Estimated 11 (L) >60 mL/min   Anion gap 24 (H) 5 - 15  CK  Result Value Ref Range   Total CK 2,524 (H) 38.0 - 234.0 U/L  Lactic acid, plasma  Result Value Ref Range   Lactic Acid, Venous 9.0 (HH) 0.5 - 1.9 mmol/L  I-stat chem 8, ED (not at [facility] or [facility])  Result Value Ref Range   Sodium 139 135 - 145 mmol/L   Potassium 4.9 3.5 - 5.1 mmol/L   Chloride 109 98 - 111 mmol/L   BUN >130 (H) 8 - 23 mg/dL   Creatinine, Ser 3.50 (H) 0.44 - 1.00 mg/dL   Glucose, Bld 315 (H) 70 - 99 mg/dL   Calcium, Ion 0.99 (L) 1.15 - 1.40 mmol/L   TCO2 15 (L) 22 - 32 mmol/L   Hemoglobin 9.5 (L) 12.0 - 15.0 g/dL   HCT 28.0 (L) 36 - 46 %  POC occult blood, ED Provider will collect  Result Value Ref Range   Fecal Occult Bld POSITIVE (A) NEGATIVE  Prepare RBC (crossmatch)  Result Value Ref Range   Order Confirmation      ORDER PROCESSED BY BLOOD BANK Performed at [facility], [address] [place], [place] [address]   Type and screen [place]  Result Value Ref Range   ABO/RH(D) O POS    Antibody Screen NEG    Sample Expiration 02/25/2020,2359    Unit Number [id number]    Blood Component Type RED CELLS,LR    Unit division 00    Status of Unit ISSUED    Transfusion Status OK TO TRANSFUSE    Crossmatch Result Compatible    Unit Number [id number]    Blood Component Type RED CELLS,LR    Unit division 00    Status of Unit ISSUED    Transfusion Status OK TO TRANSFUSE    Crossmatch Result Compatible    Unit Number [id number]    Blood Component Type RED CELLS,LR    Unit division 00    Status of Unit ISSUED    Transfusion Status  OK TO TRANSFUSE    Crossmatch Result      Compatible Performed at [facility], [place] [address] [place], [place] [address]    Unit Number [id number]    Blood Component Type RED CELLS,LR    Unit division 00    Status of Unit ALLOCATED    Transfusion Status OK TO TRANSFUSE    Crossmatch Result Compatible   Prepare RBC (crossmatch)  Result Value Ref Range   Order Confirmation      ORDER PROCESSED BY BLOOD BANK Performed at [facility], [place] [address], [place], [place] [address]   BPAM [facility]  Result Value Ref Range   ISSUE DATE / TIME [phone]    Blood Product Unit Number [id number]    PRODUCT CODE [id number]    Unit Type and Rh 5100    Blood Product Expiration Date [id number]    ISSUE DATE / TIME [id number]    Blood Product Unit Number [id number]    PRODUCT CODE [id number]    Unit Type and Rh 5100    Blood  Product Expiration Date [id number]    ISSUE DATE / TIME [id number]    Blood Product Unit Number [id number]    PRODUCT CODE [id number]    Unit Type and Rh 5100    Blood Product Expiration Date [id number]    Blood Product Unit Number [id number]    PRODUCT CODE [id number]    Unit Type and Rh 5100    Blood Product Expiration Date [id number]     EKG EKG Interpretation  Date/Time:  Saturday February 22 2020 15:56:00 EST Ventricular Rate:  91 PR Interval:    QRS Duration: 111 QT Interval:  375 QTC Calculation: 462 R Axis:   67 Text Interpretation: Sinus rhythm Nonspecific T wave abnormality Confirmed by Lajean Saver [phone]) on 03/15/2020 4:42:10 PM   Radiology CT Abdomen Pelvis Wo Contrast  Result Date: 03/11/2020 CLINICAL DATA:  Abdominal pain. EXAM: CT ABDOMEN AND PELVIS WITHOUT CONTRAST TECHNIQUE: Multidetector CT imaging of the abdomen and pelvis was performed following the standard protocol without IV contrast. COMPARISON:  CT dated 01/08/2020 FINDINGS: Lower chest: There is bibasilar airspace disease concerning for pneumonia.The  heart size is normal. Hepatobiliary: The liver is normal. Status post cholecystectomy.There is no biliary ductal dilation. Pancreas: There are calcifications in the pancreatic tail and body Spleen: Multiple calcifications are noted in the patient's spleen. Adrenals/Urinary Tract: --Adrenal glands: Unremarkable. --Right kidney/ureter: No hydronephrosis or radiopaque kidney stones. --Left kidney/ureter: No hydronephrosis or radiopaque kidney stones. --Urinary bladder: Urinary bladder is severely distended. Stomach/Bowel: --Stomach/Duodenum: Stomach is significantly distended with an air-fluid level. There is circumferential wall thickening involving the proximal duodenum (AXIAL SERIES 3, IMAGE 33). --Small bowel: Unremarkable. --Colon: There is a large amount of stool at the level of the rectum with rectal wall thickening. There is significant distention of the proximal transverse colon and hepatic flexure without a clear identifiable cause. --Appendix: Normal. Vascular/Lymphatic: Atherosclerotic calcification is present within the non-aneurysmal abdominal aorta, without hemodynamically significant stenosis. --No retroperitoneal lymphadenopathy. --No mesenteric lymphadenopathy. --No pelvic or inguinal lymphadenopathy. Reproductive: Status post hysterectomy. No adnexal mass. Other: No ascites or free air. The abdominal wall is normal. Musculoskeletal. There is a comminuted, impacted intratrochanteric fracture of the proximal left femur. Adjacent to the fracture is a small focus of gas (axial series 3, image 81). Advanced multilevel degenerative changes are noted throughout the lumbar spine. There is diffuse osteopenia. There are acute and subacute appearing left-sided rib fractures involving the fifth through ninth ribs on the left. IMPRESSION: 1. Bibasilar airspace disease concerning for pneumonia. 2. Markedly distended stomach with an air-fluid level. There is circumferential wall thickening involving the proximal  duodenum. This could be secondary to duodenitis or peptic ulcer disease. 3. Large amount of stool at the level of the rectum with rectal wall thickening, concerning for stercoral colitis. 4. Distended urinary bladder. 5. Acute and subacute appearing left-sided rib fractures involving the fifth through ninth ribs on the left. 6. There is a comminuted, impacted fracture of the proximal left femur. Adjacent to the fracture is a small focus of gas. Query open fracture. Aortic Atherosclerosis (ICD10-I70.0). Electronically Signed   By: Constance Holster M.D.   On: 03/13/2020 19:28   CT HEAD WO CONTRAST  Result Date: 03/14/2020 CLINICAL DATA:  Head trauma, found down EXAM: CT HEAD WITHOUT CONTRAST TECHNIQUE: Contiguous axial images were obtained from the base of the skull through the vertex without intravenous contrast. COMPARISON:  None. FINDINGS: Brain: No acute infarct or hemorrhage. Lateral ventricles and midline structures are unremarkable. No acute extra-axial fluid collections. No  mass effect. Vascular: No hyperdense vessel or unexpected calcification. Skull: Normal. Negative for fracture or focal lesion. Sinuses/Orbits: No acute finding. Other: None. IMPRESSION: 1. No acute intracranial process. Electronically Signed   By: Randa Ngo M.D.   On: 20-Mar-2020 19:22   CT CERVICAL SPINE WO CONTRAST  Result Date: 03-20-2020 CLINICAL DATA:  Found down, neck trauma EXAM: CT CERVICAL SPINE WITHOUT CONTRAST TECHNIQUE: Multidetector CT imaging of the cervical spine was performed without intravenous contrast. Multiplanar CT image reconstructions were also generated. COMPARISON:  None. FINDINGS: Alignment: Normal. Skull base and vertebrae: No acute fracture. No primary bone lesion or focal pathologic process. Soft tissues and spinal canal: No prevertebral fluid or swelling. No visible canal hematoma. Disc levels:  Moderate spondylosis at C3-4, C4-5, and C6-7. Upper chest: Airway is patent.  Lung apices are clear.  Other: Reconstructed images demonstrate no additional findings. IMPRESSION: 1. No acute cervical spine fracture. 2. Multilevel spondylosis. Electronically Signed   By: Randa Ngo M.D.   On: 03/20/2020 19:27   DG Chest Port 1 View  Result Date: Mar 20, 2020 CLINICAL DATA:  Code sepsis EXAM: PORTABLE CHEST 1 VIEW COMPARISON:  05/23/2008 FINDINGS: Cardiac shadow is within normal limits. Tortuous thoracic aorta is noted lungs are clear bilaterally. Old rib fractures are noted on the right. Degenerative change of the thoracic spine is noted. IMPRESSION: No acute abnormality noted. Electronically Signed   By: Inez Catalina M.D.   On: 03/20/20 16:38    Procedures Procedure Name: Intubation Date/Time: March 20, 2020 9:11 PM Performed by: Lajean Saver, MD Pre-anesthesia Checklist: Patient identified, Patient being monitored, Emergency Drugs available, Timeout performed and Suction available Oxygen Delivery Method: Non-rebreather mask Preoxygenation: Pre-oxygenation with 100% oxygen Induction Type: Rapid sequence Ventilation: Mask ventilation without difficulty Laryngoscope Size: Glidescope Tube size: 7.5 mm Number of attempts: 1 Placement Confirmation: ETT inserted through vocal cords under direct vision,  CO2 detector and Breath sounds checked- equal and bilateral Secured at: 21 cm Tube secured with: ETT holder Dental Injury: Teeth and Oropharynx as per pre-operative assessment       (including critical care time)  Medications Ordered in ED Medications  lactated ringers bolus 1,000 mL (1,000 mLs Intravenous New Bag/Given 20-Mar-2020 1616)    And  lactated ringers bolus 500 mL (has no administration in time range)    And  lactated ringers bolus 250 mL (has no administration in time range)  vancomycin (VANCOCIN) IVPB 1000 mg/200 mL premix (1,000 mg Intravenous New Bag/Given 03/20/20 1625)  0.9 %  sodium chloride infusion (has no administration in time range)  sodium chloride 0.9 % bolus 1,000  mL (1,000 mLs Intravenous New Bag/Given March 20, 2020 1602)  ceFEPIme (MAXIPIME) 2 g in sodium chloride 0.9 % 100 mL IVPB (2 g Intravenous New Bag/Given March 20, 2020 1615)    ED Course  I have reviewed the triage vital signs and the nursing notes.  Pertinent labs & imaging results that were available during my care of the patient were reviewed by me and considered in my medical decision making (see chart for details).    MDM Rules/Calculators/A&P                         Iv ns. Continuous pulse ox and cardiac monitoring. Stat labs.   Reviewed nursing notes and prior charts for additional history.   Blood pressure low - iv ns boluses. Cultures sent. Iv abx.   Called to room, as pt suddenly became unresponsive, loss of pulses. On entering room, pt  being bag ventilated. Cpr. Pt has received 1 epi. 2 ivs, ns wide open. CPR paused, return of pulses. o2 mask. Attempt to reach family/primary contact - no answer.   Initial labs reviewed/interpreted by me - hgb lower than prior. Hemoccult sent. tranfusion.  CXR reviewed/interpreted by me - no pna.   CT reviewed/interpreted by me - ?colitis. No free air. protonix iv.   Critical care consulted. Gi consulted.   On discussion with family/palliative care -  Family wished patient to remain full code.   Pt with recurrent hypotension, increased rectal bleeding. On reviewed charts 2016, ?ischemic colitis then, suspect as possible etiology now, given prolonged down period, severe dehydration, hypotension, etc. Repeat lactate pending. Pt loses pulses. Epi, cpr, additional blood/fluids. Intubated.   Return of pulses. Family updated.   Additional labs reviewed/interpreted by me - repeat lactate 9. Concern for ischemic colitis (vs worsening sepsis).  ICU md at bedside, attending patient, and having additional conversation with family.  CRITICAL CARE RE: found down, severe dehydration, AKI, uremia, azotemia, rhabdomyolysis, acute gi bleeding r/o ischemic colitis,  duodenitis, severe lactic acidosis, hypotension, acute resp failure.  Performed by: Mirna Mires Total critical care time: 140 minutes Critical care time was exclusive of separately billable procedures and treating other patients. Critical care was necessary to treat or prevent imminent or life-threatening deterioration. Critical care was time spent personally by me on the following activities: development of treatment plan with patient and/or surrogate as well as nursing, discussions with consultants, evaluation of patient's response to treatment, examination of patient, obtaining history from patient or surrogate, ordering and performing treatments and interventions, ordering and review of laboratory studies, ordering and review of radiographic studies, pulse oximetry and re-evaluation of patient's condition.      Final Clinical Impression(s) / ED Diagnoses Final diagnoses:  None    Rx / DC Orders ED Discharge Orders    None       Lajean Saver, MD 03/06/2020 2141

## 2020-02-22 NOTE — Progress Notes (Signed)
Elink monitoring for sepsis protocol 

## 2020-02-22 NOTE — Progress Notes (Signed)
Pharmacy Antibiotic Note  [name] is a 86 y.o. female admitted on 03/04/2020 with sepsis.  Pharmacy has been consulted for Cefepime and Vancomycin dosing.   Height: 5\' 3"  (160 cm) Weight: 49 kg (108 lb 0.4 oz) IBW/kg (Calculated) : 52.4  Temp (24hrs), Avg:93.8 F (34.3 C), Min:93.8 F (34.3 C), Max:93.8 F (34.3 C)  Recent Labs  Lab 02/21/2020 1555 03/11/2020 1606  WBC 8.2  --   CREATININE  --  3.50*    Estimated Creatinine Clearance: 9.3 mL/min (A) (by C-G formula based on SCr of 3.5 mg/dL (H)).    Allergies  Allergen Reactions  . Codeine     dizziness    Antimicrobials this admission: 12/4 Cefepime >>  12/4 Vancomycin >>   Dose adjustments this admission: N/a  Microbiology results: Pending   Plan:  - Cefepime 1g IV q24h - Vancomycin 1000mg  IV x 1 dose  - Will dose vancomycin based on random levels due to aki  - Monitor patients renal function and urine output  - De-escalate ABX when appropriate   Thank you for allowing pharmacy to be a part of this patient's care.  Duanne Limerick PharmD. BCPS 03/06/2020 5:03 PM

## 2020-02-22 NOTE — Procedures (Signed)
Extubation Procedure Note  Patient Details:   Name: [name] DOB: 1934/02/21 MRN: [id number]   Airway Documentation:  Airway (Active)   Vent end date: 02/20/2020 Vent end time: 2147   Evaluation  O2 sats: stable throughout and transiently fell during during procedure Complications: No apparent complications Patient did not tolerate procedure well. Bilateral Breath Sounds: Diminished   No   Terminal extubation.   [name] 03/02/2020, 9:58 PM

## 2020-02-22 NOTE — Progress Notes (Signed)
elink will now close sepsis due to terminal extubation in ED, 2nd lactic was obtained earlier, See CCM notes

## 2020-02-23 LAB — TYPE AND SCREEN
ABO/RH(D): O POS
Antibody Screen: NEGATIVE
Unit division: 0
Unit division: 0
Unit division: 0
Unit division: 0

## 2020-02-23 LAB — BPAM RBC
Blood Product Expiration Date: 202112212359
Blood Product Expiration Date: 202201042359
Blood Product Expiration Date: 202201042359
Blood Product Expiration Date: 202201072359
ISSUE DATE / TIME: 202112041932
ISSUE DATE / TIME: 202112042023
ISSUE DATE / TIME: 202112042048
Unit Type and Rh: 5100
Unit Type and Rh: 5100
Unit Type and Rh: 5100
Unit Type and Rh: 5100

## 2020-02-23 LAB — BLOOD CULTURE ID PANEL (REFLEXED) - BCID2

## 2020-02-25 LAB — CULTURE, BLOOD (ROUTINE X 2)

## 2020-02-27 LAB — CULTURE, BLOOD (ROUTINE X 2): Culture: NO GROWTH

## 2020-03-21 NOTE — Progress Notes (Signed)
Pt was referred to night chaplain by day chaplain.  Chaplain initiated a relationship of care and concern for pt's two nieces who were at beside.  Chaplain provided ministry of presence as nieces spoke of their aunt and of how sudden and unexpected this was. Chaplain prayed with family.  Chaplain advised family to reach out with any need.  Later Chaplain checked back with family to learn pt had died.  Chaplain offered prayer of committal. When the family was ready to depart, Chaplain escorted them out.  [place]

## 2020-03-21 NOTE — Death Summary Note (Signed)
Patient was admitted through the emergency room with septic shock, bilateral pneumonia, anemia with hemoglobin of 6.7, acute kidney injury, respiratory failure, courses of CPR, post fall hip fracture and rib fractures, double anoxic brain injury, and probable severe ischemic bowel.  Family elected for withdrawal of life support and comfort care while in the emergency room.  Patient was given 10 mg of morphine and epinephrine was discontinued terminal extubation.  Family was at bedside throughout.  Time of death was 21:56 on 03-10-20.  Immediate cause of death was septic shock Complicating factors were: Bilateral pneumonia Profound anemia with GI bleed Acute kidney injury Anoxic brain injury Ischemic bowel

## 2020-03-21 DEATH — deceased

## 2021-06-04 IMAGING — DX DG CHEST 1V PORT
1 series · 1 of 1 positions shown · non-contrast
Comparison: February 22, 1999

CLINICAL DATA: Intubation.

EXAM:
PORTABLE CHEST 1 VIEW

[chest]
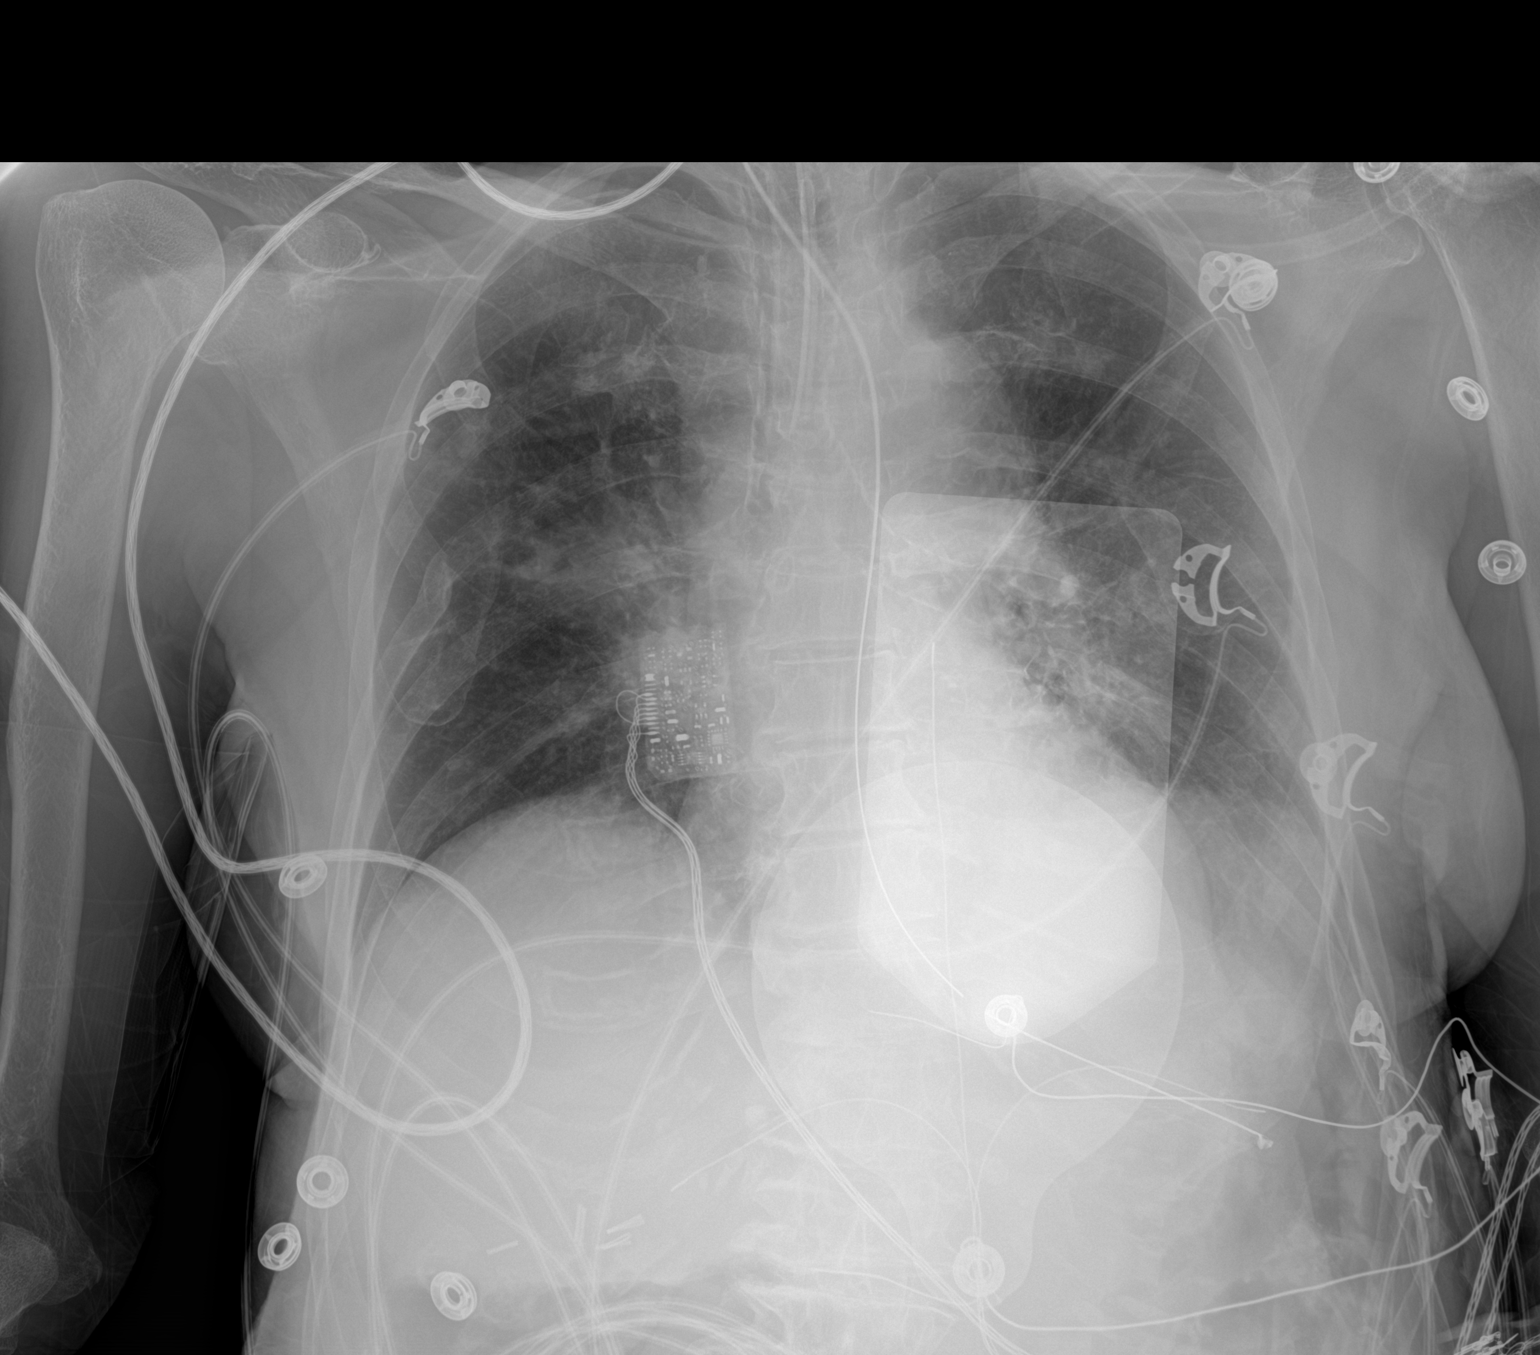

[1 of 1 positions shown; findings below may reference images not displayed]

FINDINGS: The endotracheal tube terminates above the carina. The enteric tube
extends below the left hemidiaphragm. Developing perihilar airspace
opacities are noted. There is persistent consolidation at the lung
bases, left worse than right. No pneumothorax. There are bilateral
acute and chronic rib fractures, better visualized on the patient's
prior CT.
IMPRESSION: 1. Lines and tubes as above.
2. Developing perihilar airspace opacities may represent developing
pulmonary edema or pneumonia.
3. Persistent bibasilar consolidation, left greater than right.

## 2021-06-04 IMAGING — CT CT HEAD W/O CM
4 series · 15 of 47 positions shown, 17 images · non-contrast
Comparison: None.

CLINICAL DATA: Head trauma, found down

EXAM:
CT HEAD WITHOUT CONTRAST
TECHNIQUE: Contiguous axial images were obtained from the base of the skull
through the vertex without intravenous contrast.

[Series 3: head without · axial · non-contrast · 0.44mm/px · z∈[-76,+44]mm · 7 of 32 slices shown, 9 images]
[im 4/32  brain]
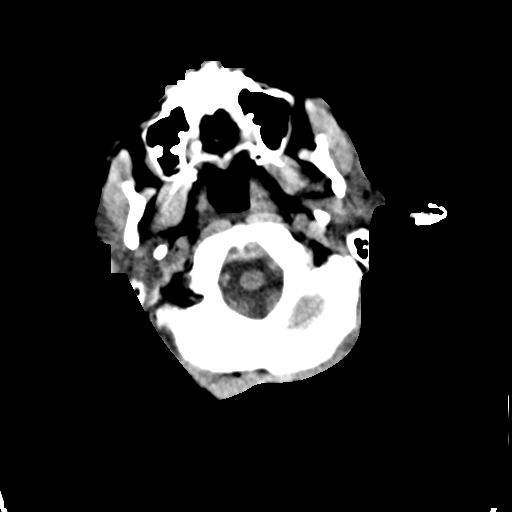
[im 4/32  bone]
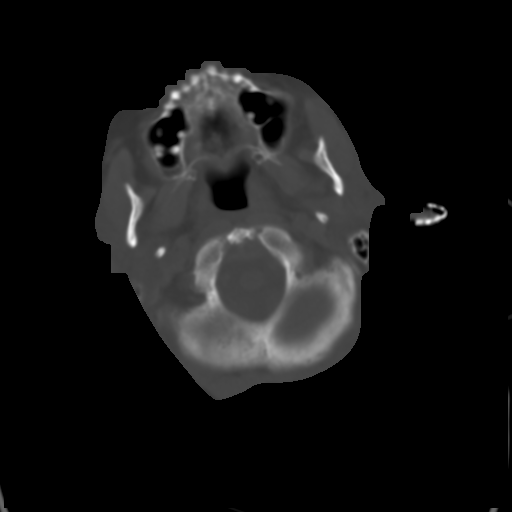
[im 8/32  brain]
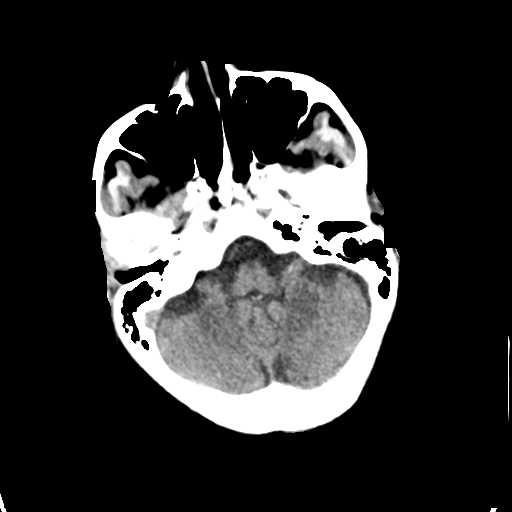
[im 12/32  brain]
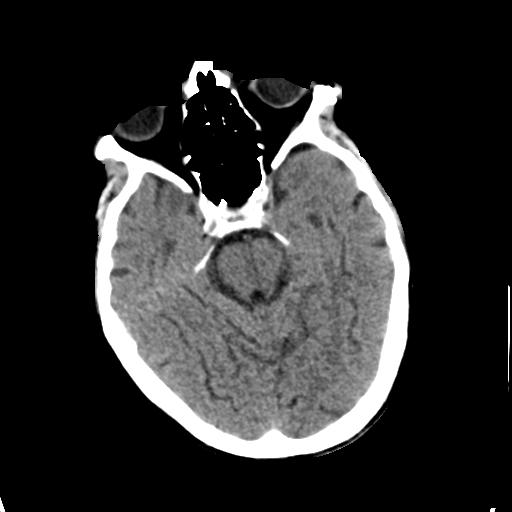
[im 16/32  brain]
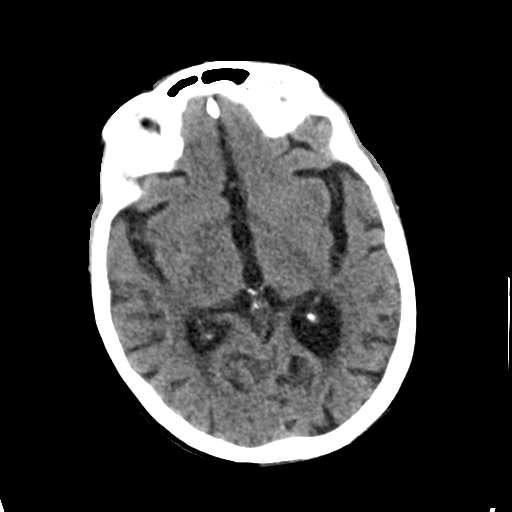
[im 20/32  brain]
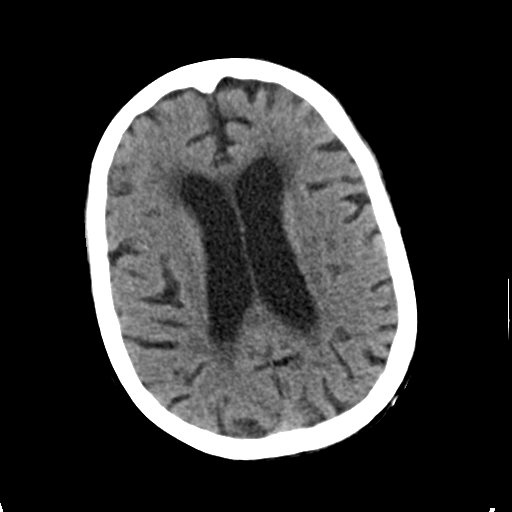
[im 20/32  bone]
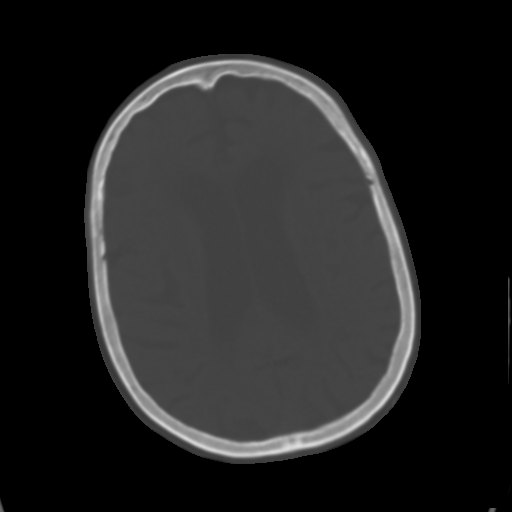
[im 24/32  brain]
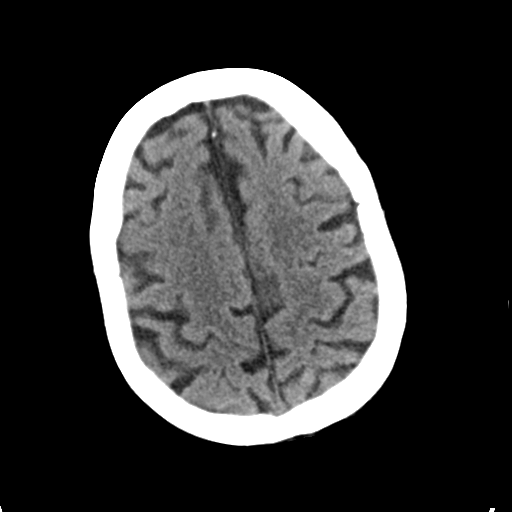
[im 28/32  brain]
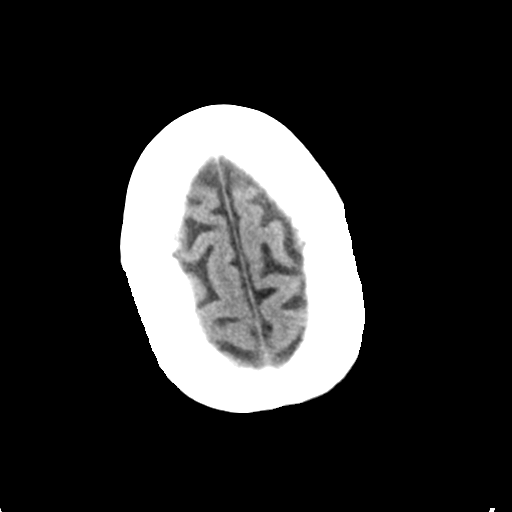

[Series 4: head bone · axial · 0.44mm/px · z∈[-77,-61]mm · 2 of 80 slices shown]
[im 8/80  bone]
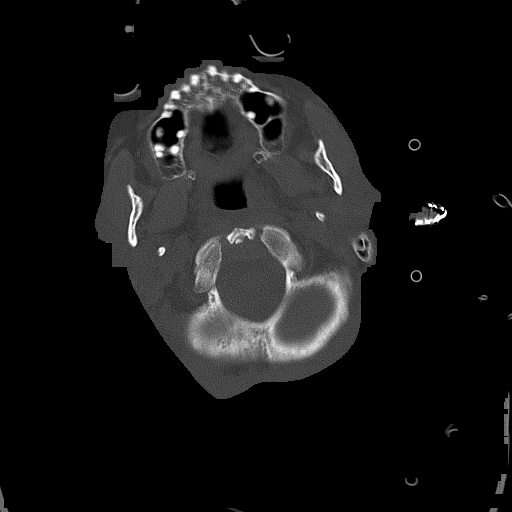
[im 16/80  bone]
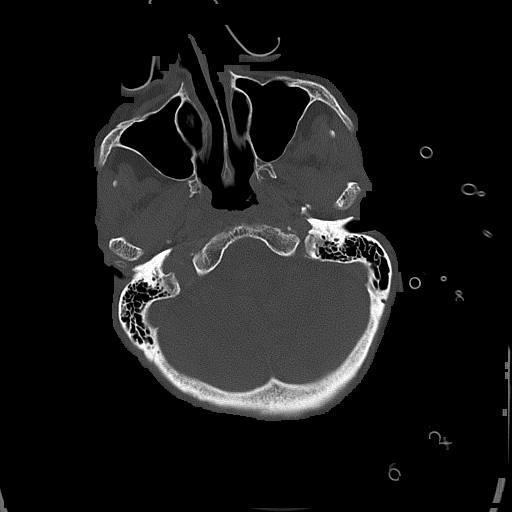

[Series 5: head without cor · coronal · non-contrast · 0.31mm/px · 3 of 67 slices shown]
[im 23/67  brain]
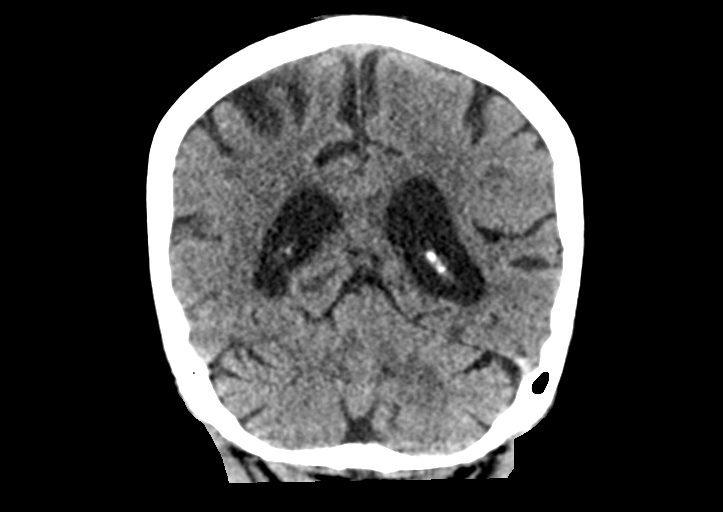
[im 30/67  brain]
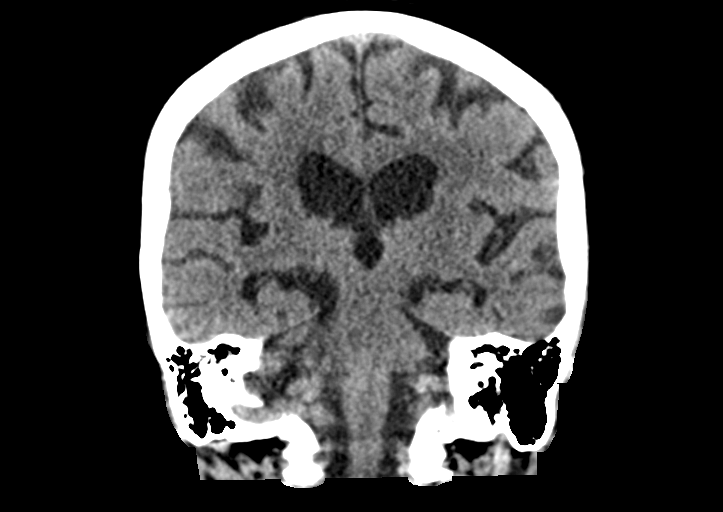
[im 37/67  brain]
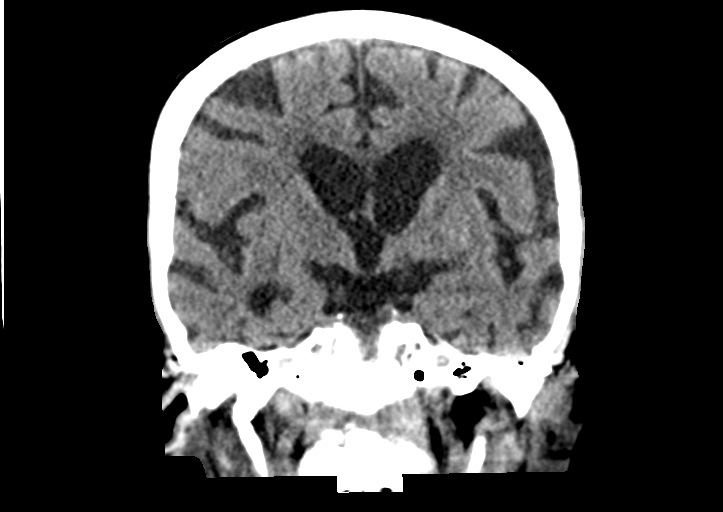

[Series 6: head without sag · sagittal · non-contrast · 0.31mm/px · 3 of 67 slices shown]
[im 23/67  brain]
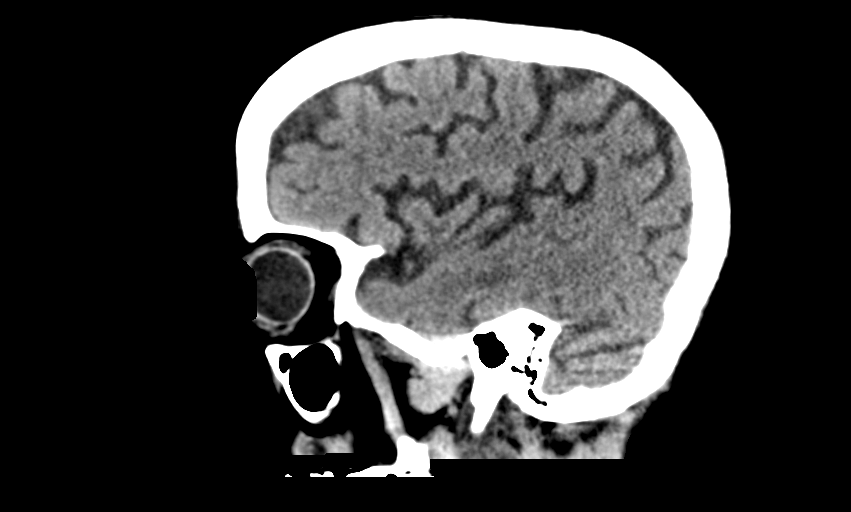
[im 34/67  brain]
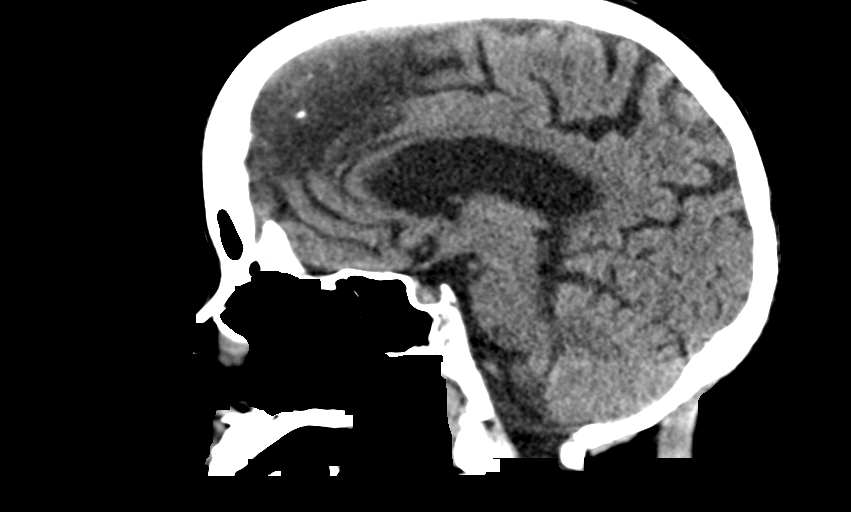
[im 45/67  brain]
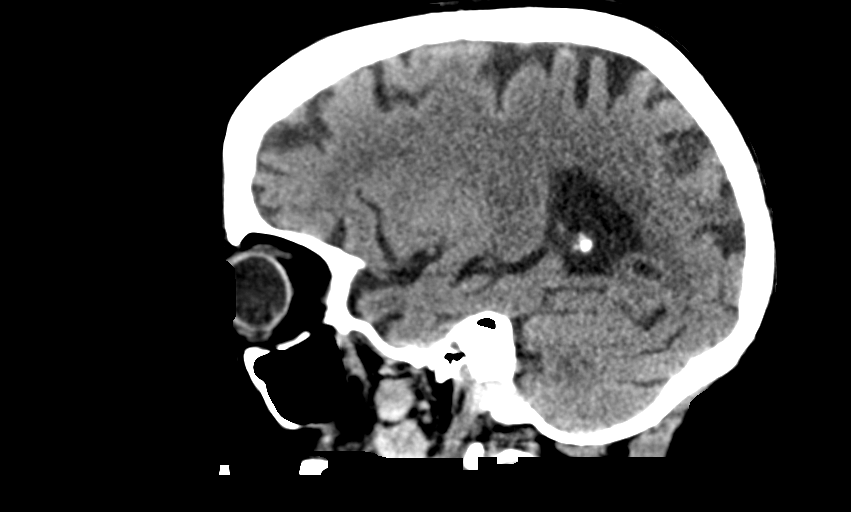

[15 of 47 positions shown; findings below may reference images not displayed]

FINDINGS: Brain: No acute infarct or hemorrhage. Lateral ventricles and
midline structures are unremarkable. No acute extra-axial fluid
collections. No mass effect.

Vascular: No hyperdense vessel or unexpected calcification.

Skull: Normal. Negative for fracture or focal lesion.

Sinuses/Orbits: No acute finding.

Other: None.
IMPRESSION: 1. No acute intracranial process.
